# Patient Record
Sex: Female | Born: 1985 | Race: White | Hispanic: No | Marital: Married | Smoking: Never smoker
Health system: Southern US, Community
[De-identification: ages and names within clinical notes are randomized; demographics above are authoritative.]

## PROBLEM LIST (undated history)

## (undated) DIAGNOSIS — Z87442 Personal history of urinary calculi: Secondary | ICD-10-CM

## (undated) DIAGNOSIS — K648 Other hemorrhoids: Secondary | ICD-10-CM

## (undated) DIAGNOSIS — J45909 Unspecified asthma, uncomplicated: Secondary | ICD-10-CM

## (undated) DIAGNOSIS — R945 Abnormal results of liver function studies: Secondary | ICD-10-CM

## (undated) DIAGNOSIS — K859 Acute pancreatitis without necrosis or infection, unspecified: Secondary | ICD-10-CM

## (undated) DIAGNOSIS — Z9889 Other specified postprocedural states: Secondary | ICD-10-CM

## (undated) DIAGNOSIS — R112 Nausea with vomiting, unspecified: Secondary | ICD-10-CM

## (undated) DIAGNOSIS — N200 Calculus of kidney: Secondary | ICD-10-CM

## (undated) HISTORY — DX: Other hemorrhoids: K64.8

## (undated) HISTORY — DX: Abnormal results of liver function studies: R94.5

## (undated) SURGERY — Surgical Case
Anesthesia: *Unknown

---
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
## 2007-12-02 HISTORY — PX: KIDNEY STONE SURGERY: SHX686

## 2008-07-30 ENCOUNTER — Emergency Department: Payer: Self-pay | Admitting: Emergency Medicine

## 2008-08-21 ENCOUNTER — Ambulatory Visit: Payer: Self-pay | Admitting: Specialist

## 2008-10-02 ENCOUNTER — Ambulatory Visit (HOSPITAL_COMMUNITY): Admission: RE | Admit: 2008-10-02 | Discharge: 2008-10-02 | Payer: Self-pay | Admitting: Urology

## 2008-10-13 ENCOUNTER — Ambulatory Visit (HOSPITAL_BASED_OUTPATIENT_CLINIC_OR_DEPARTMENT_OTHER): Admission: RE | Admit: 2008-10-13 | Discharge: 2008-10-13 | Payer: Self-pay | Admitting: Urology

## 2011-04-15 NOTE — Op Note (Signed)
NAMESHANTORIA, ELLWOOD              ACCOUNT NO.:  [id number]   MEDICAL RECORD NO.:  [id number]          PATIENT TYPE:  AMB   LOCATION:  [facility]                         FACILITY:  [facility]   PHYSICIAN:  Lindaann Slough, M.D.  DATE OF BIRTH:  27-May-1986   DATE OF PROCEDURE:  10/13/2008  DATE OF DISCHARGE:                               OPERATIVE REPORT   ATTENDING:  Dr. Wendie Simmer Nesi.   ASSISTANT:  Dr. Duane Boston.   PREOPERATIVE DIAGNOSIS:  Left ureteral stone.  Symptomatic.   POSTOPERATIVE DIAGNOSIS:  Left ureteral stone.  Symptomatic.   INDICATIONS:  A 25 year old female with a left ureteral stone on  imaging.  Could not be seen on KUB and as the patient had persistent  symptoms, she elected to proceed with ureteroscopy.  Recent benefits of  this as well as alternatives were discussed with the patient and her  mother preoperatively.   PROCEDURES:  1. Pan cystourethroscopy.  2. Left-sided retrograde pyelogram.  3. Left-sided ureteroscopy with laser lithotripsy and basket-stone      extraction.  4. Left-sided ureteral stent placement.   PROCEDURE IN DETAIL:  The patient was brought back to the operating  room, and after the successful induction of LMA anesthetic, she was  placed in the dorsal lithotomy position.  All pressure points were  padded appropriately.  She was received preoperative antibiotics, and a  preoperative time out was performed.   Using a 22-French sheath with 12-degree lens, pan cystourethroscopy was  performed.  The patient's urethra appeared normal.  There was no  evidence of stricture, tumor, foreign body, or other anomaly.  On  entering the patient's bladder, no anomalies were seen.  Both ureteral  orifices were seen at the lateral aspects of the trigone effluxing clear  urine.  There was no evidence of tumor, stone, foreign body, or other  anomaly.  At this point, the left ureteral orifice was cannulated with a  5-French catheter.  Retrieved pyelogram showed  filling defect along the  mid proximal ureter.  At this point, a sensor wire was placed in the  upper collecting system with fluoroscopic guidance.  Then, using a semi-  rigid ureteroscope, we navigated the ureter, completing ureteroscopy,  and identifying the stone along the mid proximal ureter.  At this point,  using the laser settings of 0.5 joules and 5 Hz, the laser was broken up  into 3 pieces.  All fragments were removed in their entirety from the  ureter and sent for stone analysis.  At this point, we reinjected  ureter, and there was no further stones seen.  The ureteroscope was  removed, the bladder was drained, and a 6 x 24-French soft flex ureteral  stent was placed with good proximal and distal curl seen  fluoroscopically.  At this point, the procedure was ended.   Dr. Brunilda Payor was present throughout the entire case and was the responsible  surgeon.   ESTIMATED BLOOD LOSS:  Minimal.   URINE OUTPUT:  Unrecorded.   DRAINS:  A 6 x 24-French left tethered ureteral stent.   SPECIMEN:  Stone for analysis.  DISPOSITION:  The patient taken to the PACU for further care.      Delman Kitten, MD      Lindaann Slough, M.D.  Electronically Signed    DW/MEDQ  D:  10/13/2008  T:  10/13/2008  Job:  [id number]

## 2011-09-02 LAB — POCT PREGNANCY, URINE: Preg Test, Ur: NEGATIVE

## 2011-09-02 LAB — POCT HEMOGLOBIN-HEMACUE: Hemoglobin: 13.8

## 2012-02-15 ENCOUNTER — Encounter (HOSPITAL_COMMUNITY): Payer: Self-pay

## 2012-02-15 ENCOUNTER — Emergency Department (HOSPITAL_COMMUNITY)
Admission: EM | Admit: 2012-02-15 | Discharge: 2012-02-15 | Disposition: A | Discharge status: Home / Self Care | Payer: BC Managed Care – PPO | Source: Home / Self Care | Attending: Emergency Medicine | Admitting: Emergency Medicine

## 2012-02-15 DIAGNOSIS — R202 Paresthesia of skin: Secondary | ICD-10-CM

## 2012-02-15 DIAGNOSIS — R209 Unspecified disturbances of skin sensation: Secondary | ICD-10-CM

## 2012-02-15 LAB — POCT I-STAT, CHEM 8
BUN: 9 mg/dL (ref 6–23)
Creatinine, Ser: 0.6 mg/dL (ref 0.50–1.10)
Glucose, Bld: 105 mg/dL — ABNORMAL HIGH (ref 70–99)

## 2012-02-15 NOTE — ED Notes (Signed)
Pt has numbness of lt shoulder radiating to fingers and lt thigh numbness that radiates to her toes.  Woke during the pm and felt like her fingers were swollen and could change position and it would alleviate it.  Not history of numbness and states she does workout and had some upper and lower back pain earlier in the week.

## 2012-02-15 NOTE — ED Provider Notes (Signed)
History     CSN: [id number]  Arrival date & time 02/15/12  1101   First MD Initiated Contact with Patient 02/15/12 1135      Chief Complaint  Patient presents with  . Numbness    (Consider location/radiation/quality/duration/timing/severity/associated sxs/prior treatment) HPI Comments: Patient presents urgent care complaining that for approximately 3 days has been experiencing abnormal tingling sensations on both upper arms predominantly left upper arm and left leg radiating all way down to her toes. Patient describes yesterday having unusual sensation as she was sleeping that both of her hands were swollen when affected were not. In addition patient describes about 5-6 days ago she did start expressing some lower back and neck region discomfort and tenderness. She attributed this discomfort to a small pillow she slept on. Discomfort did improve but afterwards abnormal sensation developed.  Patient describes that went through a process of tapering down her Topamax for migraines that she was on taken her last dose somewhere in the month of January. [facility], dated described that she was experiencing some numbness and tingling was she was taken Topamax before. She's been off Topamax for about a month.  Patient denies any nausea, vomiting, fevers visual disturbances,    Patient is a 26 y.o. female presenting with neurologic complaint.  Neurologic Problem The primary symptoms include headaches, dizziness and paresthesias. Primary symptoms do not include altered mental status, seizures, speech change, memory loss, fever, nausea or vomiting. The symptoms began 3 to 5 days ago. The symptoms are unchanged. The neurological symptoms are multifocal.  The headache is associated with paresthesias. The headache is not associated with aura, neck stiffness, weakness or loss of balance.  Dizziness does not occur with tinnitus, nausea, vomiting or weakness.    Additional symptoms include lower back pain. Additional symptoms do not include neck stiffness, weakness, pain, leg pain, loss of balance, aura, hallucinations, hearing loss, tinnitus, vertigo, anxiety or dysphoric mood. Medical issues do not include seizures.    Past Medical History  Diagnosis Date  . Migraine     Past Surgical History  Procedure Date  . Cholecystectomy   . Kidney stone surgery     History reviewed. No pertinent family history.  History  Substance Use Topics  . Smoking status: Never Smoker   . Smokeless tobacco: Not on file  . Alcohol Use: Yes    OB History    Grav Para Term Preterm Abortions TAB SAB Ect Mult Living                  Review of Systems  Constitutional: Negative for fever, activity change, appetite change and fatigue.  HENT: Negative for hearing loss, neck stiffness and tinnitus.   Respiratory: Negative for cough and shortness of breath.   Gastrointestinal: Negative for nausea, vomiting and abdominal distention.  Musculoskeletal: Negative for back pain and arthralgias.  Neurological: Positive for dizziness, numbness, headaches and paresthesias. Negative for vertigo, tremors, speech change, seizures, syncope, facial asymmetry, speech difficulty, weakness and loss of balance.  Psychiatric/Behavioral: Negative for hallucinations, memory loss, dysphoric mood and altered mental status.    Allergies  Hydrocodone  Home Medications  No current outpatient prescriptions on file.  BP 125/84  Pulse 81  Temp(Src) 98.5 F (36.9 C) (Oral)  Resp 16  SpO2 100%  LMP 02/04/2012  Physical Exam  Nursing note and vitals reviewed. Constitutional: She is oriented to person, place, and time. She appears well-developed and well-nourished. No distress.  HENT:  Head: Normocephalic.  Eyes: Conjunctivae and EOM are normal. Pupils are equal, round, and reactive to light. Right eye exhibits no discharge. Left eye exhibits no discharge. No scleral icterus.   Neck: Trachea normal, normal range of motion and full passive range of motion without pain. Neck supple. Muscular tenderness present. No spinous process tenderness present. Carotid bruit is not present. No rigidity. No erythema and normal range of motion present.  Pulmonary/Chest: Breath sounds normal.  Neurological: She is alert and oriented to person, place, and time. She has normal strength. She displays no atrophy and no tremor. No cranial nerve deficit or sensory deficit. She exhibits normal muscle tone. She displays a negative Romberg sign. She displays no seizure activity. Coordination normal.       Sensorial exam- Normal proprioception bilaterally, conserve 2 point discrimination on both upper extremities and lower extremities. Muscular strength is 5 out of 5 in both upper extremities and lower extremities proprioception a sterognosia conserved no abnormalities noted  Skin: Skin is warm. No rash noted.       ED Course  Procedures (including critical care time)  Labs Reviewed - No data to display No results found.   No diagnosis found.    MDM   Patient presented to urgent care with neurological symptoms have been ongoing for less than a week. No focal neurological deficiencies were noted on exam. Unknown etiology for perceive paresthesias have her fill out a referral form for consultation with neurology. Otherwise patient to be alert overall on-call as hard nurse will help her schedule a followup appointment. Patient [facility] at [facility] before. Patient agree with plan of care and understood symptoms that would require emergent attention.       Jimmie Molly, MD 02/15/12 1328

## 2012-02-15 NOTE — Discharge Instructions (Signed)
We have discussed your symptoms, we will arrange a followup consult with a local neurologist. If any interim any changes as we discuss multiple symptoms that should require immediate medical attention in the emergency department. Monitor your condition or new symptoms. In light that you have some associated discomfort in her lower back and neck area take over-the-counter naproxen for 5 days 1 tablet every 12 hours. Our scheduling first we'll contact you within the next couple days after 3 days call us back if you have an receiving a phone call from our nurse.

## 2012-02-17 ENCOUNTER — Telehealth (HOSPITAL_COMMUNITY): Payer: Self-pay | Admitting: *Deleted

## 2012-02-17 NOTE — ED Notes (Signed)
Diane from [facility] called and scheduled pt. for 4/3 @ 0930. Pt. should arrive @ 0900 to do paperwork bring insurance card, copay and list of her medications. I called pt. and gave her this information. 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 Lasts? I told her I did not know she would have to ask them. She does not know if she can do it then. I told gave her the office number and told her she would have to reschedule the time if it is not convenient for her. Vassie Moselle 02/17/2012

## 2012-02-17 NOTE — ED Notes (Signed)
Pt. called on VM @ 1032 and asked about referral to [facility]. I talked to Dr. Ladon Applebaum and told him he had put [facility] on the referral request. He said that was OK, either one is fine. Pt.'s chart and referral request faxed to Diane -new patient coordinator for [facility] and confirmation received. I called and left message for pt. @ 1236. Pt. called back on VM 2 1244.  I called [facility] @ 1410 and left message for Diane to call me. 1603 I called and left a second message for Diane. I called pt. back and told her I was waiting to hear from [facility] about the appt. and would call her when I get it. Carrie Combs 02/17/2012

## 2012-10-06 LAB — OB RESULTS CONSOLE GC/CHLAMYDIA
Chlamydia: NEGATIVE
Gonorrhea: NEGATIVE

## 2012-10-06 LAB — OB RESULTS CONSOLE RUBELLA ANTIBODY, IGM: Rubella: IMMUNE

## 2012-10-06 LAB — OB RESULTS CONSOLE RPR: RPR: NONREACTIVE

## 2012-10-11 LAB — OB RESULTS CONSOLE ANTIBODY SCREEN: Antibody Screen: NEGATIVE

## 2012-12-01 NOTE — L&D Delivery Note (Signed)
Delivery Note At 2:50 PM a viable and healthy female was delivered via Vaginal, Spontaneous Delivery (Presentation: Left Occiput Anterior).  APGAR: 9, ; weight 5lbs 13 oz .   Placenta status: Intact, Spontaneous.  Cord: 3 vessels.  Anesthesia: Epidural  Episiotomy: None Lacerations: 2nd degree;Perineal Suture Repair: 3.0 chromic Est. Blood Loss (mL): 300  Mom to postpartum.  Baby to nursery-stable.  Idolina Mantell D 12/28/2012, 4:20 PM

## 2012-12-06 LAB — OB RESULTS CONSOLE GBS
GBS: NEGATIVE
GBS: POSITIVE

## 2012-12-27 ENCOUNTER — Inpatient Hospital Stay (HOSPITAL_COMMUNITY)
Admission: AD | Admit: 2012-12-27 | Discharge: 2012-12-28 | Disposition: A | Discharge status: Home / Self Care | Payer: BC Managed Care – PPO | Source: Ambulatory Visit | Attending: Obstetrics and Gynecology | Admitting: Obstetrics and Gynecology

## 2012-12-27 DIAGNOSIS — O479 False labor, unspecified: Secondary | ICD-10-CM | POA: Insufficient documentation

## 2012-12-27 HISTORY — DX: Personal history of urinary calculi: Z87.442

## 2012-12-27 NOTE — MAU Note (Signed)
Pt states contractions started around midnight on 1/27 and have progressively gotten stronger today. States she lost her mucus plug at 3pm and the contractions seemed to be even stronger and are about 5-6 minutes apart

## 2012-12-28 ENCOUNTER — Encounter (HOSPITAL_COMMUNITY): Payer: Self-pay | Admitting: Anesthesiology

## 2012-12-28 ENCOUNTER — Encounter (HOSPITAL_COMMUNITY): Payer: Self-pay

## 2012-12-28 ENCOUNTER — Inpatient Hospital Stay (HOSPITAL_COMMUNITY): Payer: BC Managed Care – PPO | Admitting: Anesthesiology

## 2012-12-28 ENCOUNTER — Inpatient Hospital Stay (HOSPITAL_COMMUNITY)
Admission: AD | Admit: 2012-12-28 | Discharge: 2012-12-30 | DRG: 373 | Disposition: A | Discharge status: Home / Self Care | Payer: BC Managed Care – PPO | Source: Ambulatory Visit | Attending: Obstetrics & Gynecology | Admitting: Obstetrics & Gynecology

## 2012-12-28 ENCOUNTER — Encounter (HOSPITAL_COMMUNITY): Payer: Self-pay | Admitting: *Deleted

## 2012-12-28 HISTORY — DX: Unspecified asthma, uncomplicated: J45.909

## 2012-12-28 LAB — OB RESULTS CONSOLE GBS: GBS: NEGATIVE

## 2012-12-28 LAB — CBC
HCT: 40.1 % (ref 36.0–46.0)
Hemoglobin: 13.9 g/dL (ref 12.0–15.0)
RBC: 4.36 MIL/uL (ref 3.87–5.11)
WBC: 15.4 10*3/uL — ABNORMAL HIGH (ref 4.0–10.5)

## 2012-12-28 LAB — RPR: RPR Ser Ql: NONREACTIVE

## 2012-12-28 MED ORDER — DIPHENHYDRAMINE HCL 25 MG PO CAPS: 25.0000 mg | ORAL_CAPSULE | Freq: Four times a day (QID) | ORAL | Status: DC | PRN

## 2012-12-28 MED ORDER — LIDOCAINE HCL (PF) 1 % IJ SOLN: 30.0000 mL | INTRAMUSCULAR | Status: DC | PRN

## 2012-12-28 MED ORDER — LACTATED RINGERS IV SOLN
500.0000 mL | Freq: Once | INTRAVENOUS | Status: AC
  Administered 2012-12-28: 1000 mL via INTRAVENOUS

## 2012-12-28 MED ORDER — ZOLPIDEM TARTRATE 5 MG PO TABS: 5.0000 mg | ORAL_TABLET | Freq: Every evening | ORAL | Status: DC | PRN

## 2012-12-28 MED ORDER — IBUPROFEN 600 MG PO TABS
600.0000 mg | ORAL_TABLET | Freq: Four times a day (QID) | ORAL | Status: DC | PRN
  Filled 2012-12-28: qty 1

## 2012-12-28 MED ORDER — LACTATED RINGERS IV SOLN
INTRAVENOUS | Status: DC
  Administered 2012-12-28 (×2): via INTRAVENOUS

## 2012-12-28 MED ORDER — ACETAMINOPHEN 325 MG PO TABS: 650.0000 mg | ORAL_TABLET | ORAL | Status: DC | PRN

## 2012-12-28 MED ORDER — DIPHENHYDRAMINE HCL 50 MG/ML IJ SOLN: 12.5000 mg | INTRAMUSCULAR | Status: DC | PRN

## 2012-12-28 MED ORDER — CITRIC ACID-SODIUM CITRATE 334-500 MG/5ML PO SOLN: 30.0000 mL | ORAL | Status: DC | PRN

## 2012-12-28 MED ORDER — PHENYLEPHRINE 40 MCG/ML (10ML) SYRINGE FOR IV PUSH (FOR BLOOD PRESSURE SUPPORT)
80.0000 ug | PREFILLED_SYRINGE | INTRAVENOUS | Status: DC | PRN
  Filled 2012-12-28: qty 5

## 2012-12-28 MED ORDER — FENTANYL 2.5 MCG/ML BUPIVACAINE 1/10 % EPIDURAL INFUSION (WH - ANES)
INTRAMUSCULAR | Status: DC | PRN
  Administered 2012-12-28: 14 mL/h via EPIDURAL

## 2012-12-28 MED ORDER — DIBUCAINE 1 % RE OINT: 1.0000 "application " | TOPICAL_OINTMENT | RECTAL | Status: DC | PRN

## 2012-12-28 MED ORDER — FLEET ENEMA 7-19 GM/118ML RE ENEM: 1.0000 | ENEMA | RECTAL | Status: DC | PRN

## 2012-12-28 MED ORDER — BUTORPHANOL TARTRATE 1 MG/ML IJ SOLN: 1.0000 mg | INTRAMUSCULAR | Status: DC | PRN

## 2012-12-28 MED ORDER — TERBUTALINE SULFATE 1 MG/ML IJ SOLN: 0.2500 mg | Freq: Once | INTRAMUSCULAR | Status: DC | PRN

## 2012-12-28 MED ORDER — WITCH HAZEL-GLYCERIN EX PADS: 1.0000 "application " | MEDICATED_PAD | CUTANEOUS | Status: DC | PRN

## 2012-12-28 MED ORDER — OXYTOCIN 40 UNITS IN LACTATED RINGERS INFUSION - SIMPLE MED
62.5000 mL/h | INTRAVENOUS | Status: DC
  Administered 2012-12-28: 62.5 mL/h via INTRAVENOUS

## 2012-12-28 MED ORDER — OXYTOCIN 40 UNITS IN LACTATED RINGERS INFUSION - SIMPLE MED
1.0000 m[IU]/min | INTRAVENOUS | Status: DC
  Administered 2012-12-28: 2 m[IU]/min via INTRAVENOUS
  Filled 2012-12-28: qty 1000

## 2012-12-28 MED ORDER — OXYCODONE-ACETAMINOPHEN 5-325 MG PO TABS: 1.0000 | ORAL_TABLET | ORAL | Status: DC | PRN

## 2012-12-28 MED ORDER — BENZOCAINE-MENTHOL 20-0.5 % EX AERO
1.0000 "application " | INHALATION_SPRAY | CUTANEOUS | Status: DC | PRN
  Filled 2012-12-28: qty 56

## 2012-12-28 MED ORDER — LANOLIN HYDROUS EX OINT: TOPICAL_OINTMENT | CUTANEOUS | Status: DC | PRN

## 2012-12-28 MED ORDER — EPHEDRINE 5 MG/ML INJ
10.0000 mg | INTRAVENOUS | Status: DC | PRN
  Filled 2012-12-28: qty 4

## 2012-12-28 MED ORDER — FENTANYL 2.5 MCG/ML BUPIVACAINE 1/10 % EPIDURAL INFUSION (WH - ANES)
14.0000 mL/h | INTRAMUSCULAR | Status: DC
  Administered 2012-12-28: 14 mL/h via EPIDURAL
  Filled 2012-12-28 (×2): qty 125

## 2012-12-28 MED ORDER — LACTATED RINGERS IV SOLN: 500.0000 mL | INTRAVENOUS | Status: DC | PRN

## 2012-12-28 MED ORDER — OXYTOCIN BOLUS FROM INFUSION: 500.0000 mL | INTRAVENOUS | Status: DC

## 2012-12-28 MED ORDER — ONDANSETRON HCL 4 MG PO TABS: 4.0000 mg | ORAL_TABLET | ORAL | Status: DC | PRN

## 2012-12-28 MED ORDER — SENNOSIDES-DOCUSATE SODIUM 8.6-50 MG PO TABS
2.0000 | ORAL_TABLET | Freq: Every day | ORAL | Status: DC
  Administered 2012-12-28 – 2012-12-29 (×2): 2 via ORAL

## 2012-12-28 MED ORDER — ONDANSETRON HCL 4 MG/2ML IJ SOLN: 4.0000 mg | INTRAMUSCULAR | Status: DC | PRN

## 2012-12-28 MED ORDER — PRENATAL MULTIVITAMIN CH
1.0000 | ORAL_TABLET | Freq: Every day | ORAL | Status: DC
  Administered 2012-12-29 – 2012-12-30 (×2): 1 via ORAL
  Filled 2012-12-28 (×2): qty 1

## 2012-12-28 MED ORDER — LIDOCAINE HCL (PF) 1 % IJ SOLN
INTRAMUSCULAR | Status: DC | PRN
  Administered 2012-12-28 (×2): 4 mL

## 2012-12-28 MED ORDER — SIMETHICONE 80 MG PO CHEW: 80.0000 mg | CHEWABLE_TABLET | ORAL | Status: DC | PRN

## 2012-12-28 MED ORDER — IBUPROFEN 600 MG PO TABS
600.0000 mg | ORAL_TABLET | Freq: Four times a day (QID) | ORAL | Status: DC
  Administered 2012-12-28 – 2012-12-29 (×5): 600 mg via ORAL
  Filled 2012-12-28 (×8): qty 1

## 2012-12-28 MED ORDER — EPHEDRINE 5 MG/ML INJ: 10.0000 mg | INTRAVENOUS | Status: DC | PRN

## 2012-12-28 MED ORDER — ONDANSETRON HCL 4 MG/2ML IJ SOLN
4.0000 mg | Freq: Four times a day (QID) | INTRAMUSCULAR | Status: DC | PRN
  Administered 2012-12-28: 4 mg via INTRAVENOUS
  Filled 2012-12-28: qty 2

## 2012-12-28 MED ORDER — TETANUS-DIPHTH-ACELL PERTUSSIS 5-2.5-18.5 LF-MCG/0.5 IM SUSP: 0.5000 mL | Freq: Once | INTRAMUSCULAR | Status: DC

## 2012-12-28 MED ORDER — PHENYLEPHRINE 40 MCG/ML (10ML) SYRINGE FOR IV PUSH (FOR BLOOD PRESSURE SUPPORT): 80.0000 ug | PREFILLED_SYRINGE | INTRAVENOUS | Status: DC | PRN

## 2012-12-28 NOTE — Anesthesia Preprocedure Evaluation (Signed)
Anesthesia Evaluation  Patient identified by MRN, date of birth, ID band Patient awake    Reviewed: Allergy & Precautions, H&P , Patient's Chart, lab work & pertinent test results  Airway Mallampati: III TM Distance: >3 FB Neck ROM: full    Dental No notable dental hx. (+) Teeth Intact   Pulmonary neg pulmonary ROS,  breath sounds clear to auscultation  Pulmonary exam normal       Cardiovascular negative cardio ROS  Rhythm:regular Rate:Normal     Neuro/Psych negative psych ROS   GI/Hepatic negative GI ROS, Neg liver ROS,   Endo/Other  negative endocrine ROS  Renal/GU Renal Calculi  negative genitourinary   Musculoskeletal   Abdominal Normal abdominal exam  (+)   Peds  Hematology negative hematology ROS (+)   Anesthesia Other Findings   Reproductive/Obstetrics (+) Pregnancy                           Anesthesia Physical Anesthesia Plan  ASA: II  Anesthesia Plan: Epidural   Post-op Pain Management:    Induction:   Airway Management Planned:   Additional Equipment:   Intra-op Plan:   Post-operative Plan:   Informed Consent: I have reviewed the patients History and Physical, chart, labs and discussed the procedure including the risks, benefits and alternatives for the proposed anesthesia with the patient or authorized representative who has indicated his/her understanding and acceptance.     Plan Discussed with: Anesthesiologist  Anesthesia Plan Comments:         Anesthesia Quick Evaluation

## 2012-12-28 NOTE — Anesthesia Postprocedure Evaluation (Signed)
  Anesthesia Post-op Note  Patient: Carrie Combs  Procedure(s) Performed: * No procedures listed *  Patient Location: PACU and Mother/Baby  Anesthesia Type:Epidural  Level of Consciousness: awake, alert  and oriented  Airway and Oxygen Therapy: Patient Spontanous Breathing  Post-op Pain: mild  Post-op Assessment: Patient's Cardiovascular Status Stable, Respiratory Function Stable, No signs of Nausea or vomiting, Adequate PO intake, Pain level controlled, No headache, No backache, No residual numbness and No residual motor weakness  Post-op Vital Signs: stable  Complications: No apparent anesthesia complications

## 2012-12-28 NOTE — H&P (Signed)
27 y.o. G1P0  Estimated Date of Delivery: 01/01/13 admitted at 39/[redacted] weeks gestation in dysfunctional latent phase labor.  Patient with persistent but irregular contractions.  Progressed from 2/50 to 3/80 in 7 hours.  Prenatal Transfer Tool  Maternal Diabetes: No Genetic Screening: Normal Maternal Ultrasounds/Referrals: Normal Fetal Ultrasounds or other Referrals:  None Maternal Substance Abuse:  No Significant Maternal Medications:  None Significant Maternal Lab Results: None Other Significant Pregnancy Complications:  None  Afebrile, VSS Heart and Lungs: No active disease Abdomen: soft, gravid, EFW AGA. Cervical exam:  3/80  Impression: Dysfunctional latent phase  Plan:  Admit for IV pitocin and TOL.

## 2012-12-28 NOTE — MAU Note (Signed)
Pt would like to take placenta home for encapsulation

## 2012-12-28 NOTE — Anesthesia Procedure Notes (Signed)
Epidural Patient location during procedure: OB Start time: 12/28/2012 8:54 AM  Staffing Anesthesiologist: Kiaraliz Rafuse A. Performed by: anesthesiologist   Preanesthetic Checklist Completed: patient identified, site marked, surgical consent, pre-op evaluation, timeout performed, IV checked, risks and benefits discussed and monitors and equipment checked  Epidural Patient position: sitting Prep: site prepped and draped and DuraPrep Patient monitoring: continuous pulse ox and blood pressure Approach: midline Injection technique: LOR air  Needle:  Needle type: Tuohy  Needle gauge: 17 G Needle length: 9 cm and 9 Needle insertion depth: 4 cm Catheter type: closed end flexible Catheter size: 19 Gauge Catheter at skin depth: 8 cm Test dose: negative and Other  Assessment Events: blood not aspirated, injection not painful, no injection resistance, negative IV test and no paresthesia  Additional Notes Patient identified. Risks and benefits discussed including failed block, incomplete  Pain control, post dural puncture headache, nerve damage, paralysis, blood pressure Changes, nausea, vomiting, reactions to medications-both toxic and allergic and post Partum back pain. All questions were answered. Patient expressed understanding and wished to proceed. Sterile technique was used throughout procedure. Epidural site was Dressed with sterile barrier dressing. No paresthesias, signs of intravascular injection Or signs of intrathecal spread were encountered.  Patient was more comfortable after the epidural was dosed. Please see RN's note for documentation of vital signs and FHR which are stable.

## 2012-12-28 NOTE — MAU Note (Signed)
Pt came in for labor eval earlier today, was sent home, 1-2/50/posterior, here because ctx's are stronger, unable to sleep.

## 2012-12-29 LAB — CBC
MCHC: 33.5 g/dL (ref 30.0–36.0)
Platelets: 157 10*3/uL (ref 150–400)
RDW: 13 % (ref 11.5–15.5)
WBC: 15.7 10*3/uL — ABNORMAL HIGH (ref 4.0–10.5)

## 2012-12-29 NOTE — Progress Notes (Signed)
PPD#1 Pt has no complaints. Lochia-wnl VSSAF IMP/ stable PLAN/ Routine care.

## 2012-12-30 MED ORDER — IBUPROFEN 600 MG PO TABS: 600.0000 mg | ORAL_TABLET | Freq: Four times a day (QID) | ORAL | Status: DC | PRN

## 2012-12-30 MED ORDER — DOCUSATE SODIUM 100 MG PO CAPS: 100.0000 mg | ORAL_CAPSULE | Freq: Two times a day (BID) | ORAL | Status: DC

## 2012-12-30 NOTE — Discharge Summary (Signed)
Obstetric Discharge Summary Reason for Admission: onset of labor Prenatal Procedures: ultrasound Intrapartum Procedures: spontaneous vaginal delivery Postpartum Procedures: none Complications-Operative and Postpartum: 2nd degree perineal laceration Hemoglobin  Date Value Range Status  12/29/2012 10.7* 12.0 - 15.0 g/dL Final     DELTA CHECK NOTED     REPEATED TO VERIFY     HCT  Date Value Range Status  12/29/2012 31.9* 36.0 - 46.0 % Final    Physical Exam:  General: alert, cooperative and appears stated age 27: appropriate Uterine Fundus: firm  Discharge Diagnoses: Term Pregnancy-delivered  Discharge Information: Date: 12/30/2012 Activity: pelvic rest Diet: routine Medications: Ibuprofen, Colace and Vicodin Condition: improved Instructions: refer to practice specific booklet Discharge to: home Follow-up Information    Follow up with Mickel Baas, MD. In 4 weeks. (For a postpartum evaluation)    Contact information:   [address] [place] [address] [phone]          Newborn Data: Live born female  Birth Weight: 5 lb 13 oz (2637 g) APGAR: 9,   Home with mother.  Arriana Lohmann H. 12/30/2012, 8:55 AM

## 2013-07-29 ENCOUNTER — Ambulatory Visit (INDEPENDENT_AMBULATORY_CARE_PROVIDER_SITE_OTHER): Payer: BC Managed Care – PPO | Admitting: Surgery

## 2013-07-29 ENCOUNTER — Encounter (INDEPENDENT_AMBULATORY_CARE_PROVIDER_SITE_OTHER): Payer: Self-pay | Admitting: Surgery

## 2013-07-29 VITALS — BP 114/66 | HR 64 | Temp 98.1°F | Resp 14 | Ht 63.0 in | Wt 99.6 lb

## 2013-07-29 DIAGNOSIS — K602 Anal fissure, unspecified: Secondary | ICD-10-CM

## 2013-07-29 NOTE — Progress Notes (Signed)
Patient ID: Carrie Combs, female   DOB: 17-Oct-1986, 27 y.o.   MRN: [id number]  Chief Complaint  Patient presents with  . New Evaluation    eval anal fissure    HPI Carrie Combs is a 27 y.o. female.  Patient sent at request of Dr. Arlyce Dice for anal fissure. She had a natural vaginal childbirth in January of this year but had tearing in her perineum that has been managed medically on and off for the last 7 months. She has been treated for an anal fissure that heals and then cracks after bowel movements. She has had some rectal bleeding. Her symptoms are intermittent and include severe anal pain and rectal bleeding. She has tried topical steroid creams with marginal success. Reports no history of constipation but does strain from time to time. She does report intermittent painful intercourse as well. HPI  Past Medical History  Diagnosis Date  . Migraine   . History of kidney stones   . Asthma     exercise-induced    Past Surgical History  Procedure Laterality Date  . Cholecystectomy    . Kidney stone surgery    . Wisdom tooth extraction      Family History  Problem Relation Age of Onset  . Other Neg Hx   . Cancer Paternal Grandfather     liver    Social History History  Substance Use Topics  . Smoking status: Never Smoker   . Smokeless tobacco: Never Used  . Alcohol Use: Yes     Comment: once weekly    Allergies  Allergen Reactions  . Hydrocodone Nausea And Vomiting    Current Outpatient Prescriptions  Medication Sig Dispense Refill  . Prenatal Vit-Fe Fumarate-FA (PRENATAL MULTIVITAMIN) TABS Take 1 tablet by mouth at bedtime.       No current facility-administered medications for this visit.    Review of Systems Review of Systems  Constitutional: Negative.   HENT: Negative.   Eyes: Negative.   Respiratory: Negative.   Cardiovascular: Negative.   Gastrointestinal: Positive for blood in stool and anal bleeding.  Endocrine: Negative.   Genitourinary:  Negative.   Allergic/Immunologic: Negative.   Neurological: Negative.   Hematological: Negative.   Psychiatric/Behavioral: Negative.     Blood pressure 114/66, pulse 64, temperature 98.1 F (36.7 C), temperature source Temporal, resp. rate 14, height 5\' 3"  (1.6 m), weight 99 lb 9.6 oz (45.178 kg).  Physical Exam Physical Exam  Constitutional: She appears well-developed and well-nourished.  HENT:  Head: Normocephalic and atraumatic.  Genitourinary:     Skin: Skin is warm and dry.  Psychiatric: She has a normal mood and affect. Her behavior is normal. Thought content normal.    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 Start diltiazem gel 4 times a day. Recommend high-fiber diet and fiber supplement. Return to clinic in 3 weeks for reassessment. If no better, may need referral to colorectal specialist given history of traumatic childbirth and potential for damage to pelvic floor and sphincter mechanism. May need ultrasound for further evaluation.       Alee Katen A. 07/29/2013, 9:46 AM

## 2013-07-29 NOTE — Patient Instructions (Addendum)
Anal Fissure, Adult  An anal fissure is a small tear or crack in the skin around the anus. Bleeding from a fissure usually stops on its own within a few minutes. However, bleeding will often reoccur with each bowel movement until the crack heals.   CAUSES    Passing large, hard stools.   Frequent diarrheal stools.   Constipation.   Inflammatory bowel disease (Crohn's disease or ulcerative colitis).   Infections.   Anal sex.  SYMPTOMS    Small amounts of blood seen on your stools, on toilet paper, or in the toilet after a bowel movement.   Rectal bleeding.   Painful bowel movements.   Itching or irritation around the anus.  DIAGNOSIS  Your caregiver will examine the anal area. An anal fissure can usually be seen with careful inspection. A rectal exam may be performed and a short tube (anoscope) may be used to examine the anal canal.  TREATMENT    You may be instructed to take fiber supplements. These supplements can soften your stool to help make bowel movements easier.   Sitz baths may be recommended to help heal the tear. Do not use soap in the sitz baths.   A medicated cream or ointment may be prescribed to lessen discomfort.  HOME CARE INSTRUCTIONS    Maintain a diet high in fruits, whole grains, and vegetables. Avoid constipating foods like bananas and dairy products.   Take sitz baths as directed by your caregiver.   Drink enough fluids to keep your urine clear or pale yellow.   Only take over-the-counter or prescription medicines for pain, discomfort, or fever as directed by your caregiver. Do not take aspirin as this may increase bleeding.   Do not use ointments containing numbing medications (anesthetics) or hydrocortisone. They could slow healing.  SEEK MEDICAL CARE IF:    Your fissure is not completely healed within 3 days.   You have further bleeding.   You have a fever.   You have diarrhea mixed with blood.   You have pain.   Your problem is getting worse rather than  better.  MAKE SURE YOU:    Understand these instructions.   Will watch your condition.   Will get help right away if you are not doing well or get worse.  Document Released: 11/17/2005 Document Revised: 02/09/2012 Document Reviewed: 05/04/2011  [employer] Patient Information 2014 [employer]

## 2013-08-15 ENCOUNTER — Encounter (INDEPENDENT_AMBULATORY_CARE_PROVIDER_SITE_OTHER): Payer: BC Managed Care – PPO | Admitting: Surgery

## 2013-08-25 ENCOUNTER — Ambulatory Visit (INDEPENDENT_AMBULATORY_CARE_PROVIDER_SITE_OTHER): Payer: BC Managed Care – PPO | Admitting: Surgery

## 2013-08-25 ENCOUNTER — Encounter (INDEPENDENT_AMBULATORY_CARE_PROVIDER_SITE_OTHER): Payer: Self-pay | Admitting: Surgery

## 2013-08-25 VITALS — BP 98/60 | HR 68 | Temp 97.4°F | Resp 16 | Ht 63.0 in | Wt 97.4 lb

## 2013-08-25 DIAGNOSIS — K602 Anal fissure, unspecified: Secondary | ICD-10-CM

## 2013-08-25 NOTE — Progress Notes (Signed)
Subjective:     Patient ID: Carrie Combs, female   DOB: [birth year], 27 y.o.   MRN: [id number]  HPI Patient returns for followup of anal fissure. She is feeling better. She has less pain. She does feel a sensation of stretching with her bowel movements especially anterior right anal canal. No bleeding or severe pain. Bowel function normal.  Review of Systems  Constitutional: Negative.   HENT: Negative.   Gastrointestinal: Negative.        Objective:   Physical Exam  Constitutional: She is oriented to person, place, and time. She appears well-developed.  Genitourinary:     Musculoskeletal: Normal range of motion.  Neurological: She is alert and oriented to person, place, and time.  Skin: Skin is warm and dry.       Assessment:     Anal fissure improving on medical management.    Plan:     Continue medical management. Return in 3 weeks for recheck.

## 2013-08-25 NOTE — Patient Instructions (Signed)
Continue present treatment.  Return 3 weeks.

## 2013-09-16 ENCOUNTER — Encounter (INDEPENDENT_AMBULATORY_CARE_PROVIDER_SITE_OTHER): Payer: BC Managed Care – PPO | Admitting: Surgery

## 2013-09-30 ENCOUNTER — Telehealth (INDEPENDENT_AMBULATORY_CARE_PROVIDER_SITE_OTHER): Payer: Self-pay

## 2013-09-30 NOTE — Telephone Encounter (Signed)
Called pt and gave her appt with Dr Maisie Fus 11/10 at 9:40.

## 2013-09-30 NOTE — Telephone Encounter (Signed)
Message copied by Brennan Bailey on Fri Sep 30, 2013  2:53 PM ------      Message from: Isaias Sakai K      Created: Thu Sep 29, 2013 11:17 AM      Regarding: Dr Luisa Hart      Contact: [phone]       Patient is ready to see the other doctor that Dr Luisa Hart wants her to see which is in this practice. She can't remember who that is. Would like a call ------

## 2013-10-10 ENCOUNTER — Ambulatory Visit (INDEPENDENT_AMBULATORY_CARE_PROVIDER_SITE_OTHER): Payer: BC Managed Care – PPO | Admitting: General Surgery

## 2013-10-10 ENCOUNTER — Encounter (INDEPENDENT_AMBULATORY_CARE_PROVIDER_SITE_OTHER): Payer: Self-pay | Admitting: General Surgery

## 2013-10-10 VITALS — BP 108/70 | HR 84 | Temp 97.4°F | Resp 18 | Ht 63.0 in | Wt 98.0 lb

## 2013-10-10 DIAGNOSIS — K6289 Other specified diseases of anus and rectum: Secondary | ICD-10-CM

## 2013-10-10 NOTE — Patient Instructions (Addendum)
Make sure you are taking your fiber supplement and a stool softener daily.   Use warm baths after BM's.  Continue diltiazem until you've stopped breast feeding        GETTING TO GOOD BOWEL HEALTH. Irregular bowel habits such as constipation can lead to many problems over time.  Having one soft bowel movement a day is the most important way to prevent further problems.  The anorectal canal is designed to handle stretching and feces to safely manage our ability to get rid of solid waste (feces, poop, stool) out of our body.  BUT, hard constipated stools can act like ripping concrete bricks causing inflamed hemorrhoids, anal fissures, abdominal pain and bloating.     The goal: ONE SOFT BOWEL MOVEMENT A DAY!  To have soft, regular bowel movements:    Drink at least 8 tall glasses of water a day.     Take plenty of fiber.  Fiber is the undigested part of plant food that passes into the colon, acting s "natures broom" to encourage bowel motility and movement.  Fiber can absorb and hold large amounts of water. This results in a larger, bulkier stool, which is soft and easier to pass. Work gradually over several weeks up to 6 servings a day of fiber (25g a day even more if needed) in the form of: o Vegetables -- Root (potatoes, carrots, turnips), leafy green (lettuce, salad greens, celery, spinach), or cooked high residue (cabbage, broccoli, etc) o Fruit -- Fresh (unpeeled skin & pulp), Dried (prunes, apricots, cherries, etc ),  or stewed ( applesauce)  o Whole grain breads, pasta, etc (whole wheat)  o Bran cereals    Bulking Agents -- This type of water-retaining fiber generally is easily obtained each day by one of the following:  o Psyllium bran -- The psyllium plant is remarkable because its ground seeds can retain so much water. This product is available as Metamucil, Konsyl, Effersyllium, Per Diem Fiber, or the less expensive generic preparation in drug and health food stores. Although labeled a  laxative, it really is not a laxative.  o Methylcellulose -- This is another fiber derived from wood which also retains water. It is available as Citrucel. o Polyethylene Glycol - and "artificial" fiber commonly called Miralax or Glycolax.  It is helpful for people with gassy or bloated feelings with regular fiber o Flax Seed - a less gassy fiber than psyllium   No reading or other relaxing activity while on the toilet. If bowel movements take longer than 5 minutes, you are too constipated   AVOID CONSTIPATION.  High fiber and water intake usually takes care of this.  Sometimes a laxative is needed to stimulate more frequent bowel movements, but    Laxatives are not a good long-term solution as it can wear the colon out. o Osmotics (Milk of Magnesia, Fleets phosphosoda, Magnesium citrate, MiraLax, GoLytely) are safer than  o Stimulants (Senokot, Castor Oil, Dulcolax, Ex Lax)    o Do not take laxatives for more than 7days in a row.    IF SEVERELY CONSTIPATED, try a Bowel Retraining Program: o Do not use laxatives.  o Eat a diet high in roughage, such as bran cereals and leafy vegetables.  o Drink six (6) ounces of prune or apricot juice each morning.  o Eat two (2) large servings of stewed fruit each day.  o Take one (1) heaping tablespoon of a psyllium-based bulking agent twice a day. Use sugar-free sweetener when possible to avoid  excessive calories.  o Eat a normal breakfast.  o Set aside 15 minutes after breakfast to sit on the toilet, but do not strain to have a bowel movement.  o If you do not have a bowel movement by the third day, use an enema and repeat the above steps.

## 2013-10-10 NOTE — Progress Notes (Signed)
Chief Complaint  Patient presents with  . Other    anal fissure    HISTORY: Carrie Combs is a 27 y.o. female who presents to the office with anal pain.  Other symptoms include swelling.  This had been occurring for several months.  She has tried diltiazem cream in the past for 2 months with some improvement but continues to have pain and a stretching sensation with bleeding during BM's.  It is continuous in nature.  Her bowel habits are regular and her bowel movements are mostly soft.  Her fiber intake is through a supplement every other day.  She had a minor posterior vaginal tear during her childbirth in Jan and a very hard stool ~2 weeks later.  She has had problems with this since then.       Past Medical History  Diagnosis Date  . Migraine   . History of kidney stones   . Asthma     exercise-induced      Past Surgical History  Procedure Laterality Date  . Cholecystectomy    . Kidney stone surgery    . Wisdom tooth extraction          Current Outpatient Prescriptions  Medication Sig Dispense Refill  . Prenatal Vit-Fe Fumarate-FA (PRENATAL MULTIVITAMIN) TABS Take 1 tablet by mouth at bedtime.       No current facility-administered medications for this visit.      Allergies  Allergen Reactions  . Hydrocodone Nausea And Vomiting      Family History  Problem Relation Age of Onset  . Other Neg Hx   . Cancer Paternal Grandfather     liver    History   Social History  . Marital Status: Single    Spouse Name: N/A    Number of Children: N/A  . Years of Education: N/A   Social History Main Topics  . Smoking status: Never Smoker   . Smokeless tobacco: Never Used  . Alcohol Use: Yes     Comment: once weekly  . Drug Use: No  . Sexual Activity: No   Other Topics Concern  . None   Social History Narrative  . None      REVIEW OF SYSTEMS - PERTINENT POSITIVES ONLY: Review of Systems - General ROS: negative for - chills, fever or weight loss Hematological and  Lymphatic ROS: negative for - bleeding problems, blood clots or bruising Respiratory ROS: no cough, shortness of breath, or wheezing Cardiovascular ROS: no chest pain or dyspnea on exertion Gastrointestinal ROS: no abdominal pain, change in bowel habits, or black or bloody stools Genito-Urinary ROS: no dysuria, trouble voiding, or hematuria  EXAM: Filed Vitals:   10/10/13 0932  BP: 108/70  Pulse: 84  Temp: 97.4 F (36.3 C)  Resp: 18    General appearance: alert and cooperative Resp: clear to auscultation bilaterally Cardio: regular rate and rhythm GI: soft, non-tender; bowel sounds normal; no masses,  no organomegaly Anal: no fissures, small minor breaks in the perianal skin, perineal body intact, slight sphincter hypertrophy.  She did have some pain to palpation in the posterior anal wall    ASSESSMENT AND PLAN:  Carrie Combs is a 27 y.o. F with h/o anal fissure.  On exam, I do not see evidence of a true anal fissure now but she does have some pain posteriorly and sphincter hypertrophy.  She will continue her diltiazem for now and add a stool softener and fiber supplement daily.  If she continues to have  problems, once she has stopped breast feeding, we will switch her to nitroglycerine cream.  I do not think a surgery or botox would help, given the abscess of a true fissure.     Carrie Panda, MD Colon and Rectal Surgery / General Surgery [facility]      Visit Diagnoses: 1. Anal pain     Primary Care Physician: No primary provider on file.

## 2013-12-20 ENCOUNTER — Encounter (INDEPENDENT_AMBULATORY_CARE_PROVIDER_SITE_OTHER): Payer: Self-pay | Admitting: General Surgery

## 2014-01-19 ENCOUNTER — Emergency Department: Payer: Self-pay | Admitting: Internal Medicine

## 2014-01-19 LAB — COMPREHENSIVE METABOLIC PANEL
ALK PHOS: 139 U/L — AB
ALT: 125 U/L — AB (ref 12–78)
Albumin: 4.4 g/dL (ref 3.4–5.0)
Anion Gap: 8 (ref 7–16)
BUN: 13 mg/dL (ref 7–18)
Bilirubin,Total: 1 mg/dL (ref 0.2–1.0)
CALCIUM: 9.6 mg/dL (ref 8.5–10.1)
CHLORIDE: 103 mmol/L (ref 98–107)
CO2: 25 mmol/L (ref 21–32)
Creatinine: 0.69 mg/dL (ref 0.60–1.30)
EGFR (African American): >60
Glucose: 118 mg/dL — ABNORMAL HIGH (ref 65–99)
Osmolality: 273 (ref 275–301)
POTASSIUM: 3.9 mmol/L (ref 3.5–5.1)
SGOT(AST): 193 U/L — ABNORMAL HIGH (ref 15–37)
SODIUM: 136 mmol/L (ref 136–145)
Total Protein: 8.4 g/dL — ABNORMAL HIGH (ref 6.4–8.2)

## 2014-01-19 LAB — CBC
HCT: 47 % (ref 35.0–47.0)
HGB: 16 g/dL (ref 12.0–16.0)
MCH: 30.8 pg (ref 26.0–34.0)
MCHC: 34.1 g/dL (ref 32.0–36.0)
MCV: 90 fL (ref 80–100)
PLATELETS: 253 10*3/uL (ref 150–440)
RBC: 5.21 10*6/uL — AB (ref 3.80–5.20)
RDW: 12.7 % (ref 11.5–14.5)
WBC: 15.6 10*3/uL — ABNORMAL HIGH (ref 3.6–11.0)

## 2014-01-19 LAB — URINALYSIS, COMPLETE
BILIRUBIN, UR: NEGATIVE
Bacteria: NONE SEEN
Blood: NEGATIVE
Glucose,UR: NEGATIVE mg/dL (ref 0–75)
Leukocyte Esterase: NEGATIVE
Nitrite: NEGATIVE
PROTEIN: NEGATIVE
Ph: 6 (ref 4.5–8.0)
RBC,UR: <1 /HPF (ref 0–5)
Specific Gravity: 1.027 (ref 1.003–1.030)
Squamous Epithelial: 1

## 2014-01-19 LAB — LIPASE, BLOOD: Lipase: 120 U/L (ref 73–393)

## 2014-01-19 LAB — PREGNANCY, URINE: Pregnancy Test, Urine: NEGATIVE m[IU]/mL

## 2014-07-11 ENCOUNTER — Emergency Department (HOSPITAL_BASED_OUTPATIENT_CLINIC_OR_DEPARTMENT_OTHER)
Admission: EM | Admit: 2014-07-11 | Discharge: 2014-07-11 | Disposition: A | Discharge status: Home / Self Care | Payer: Managed Care, Other (non HMO) | Attending: Emergency Medicine | Admitting: Emergency Medicine

## 2014-07-11 ENCOUNTER — Encounter (HOSPITAL_BASED_OUTPATIENT_CLINIC_OR_DEPARTMENT_OTHER): Payer: Self-pay | Admitting: Emergency Medicine

## 2014-07-11 ENCOUNTER — Emergency Department (HOSPITAL_BASED_OUTPATIENT_CLINIC_OR_DEPARTMENT_OTHER): Payer: Managed Care, Other (non HMO)

## 2014-07-11 DIAGNOSIS — Z79899 Other long term (current) drug therapy: Secondary | ICD-10-CM | POA: Diagnosis not present

## 2014-07-11 DIAGNOSIS — Z87442 Personal history of urinary calculi: Secondary | ICD-10-CM | POA: Diagnosis not present

## 2014-07-11 DIAGNOSIS — Z8679 Personal history of other diseases of the circulatory system: Secondary | ICD-10-CM | POA: Diagnosis not present

## 2014-07-11 DIAGNOSIS — R1012 Left upper quadrant pain: Secondary | ICD-10-CM | POA: Insufficient documentation

## 2014-07-11 DIAGNOSIS — O9989 Other specified diseases and conditions complicating pregnancy, childbirth and the puerperium: Secondary | ICD-10-CM | POA: Diagnosis present

## 2014-07-11 DIAGNOSIS — J45909 Unspecified asthma, uncomplicated: Secondary | ICD-10-CM | POA: Insufficient documentation

## 2014-07-11 LAB — URINALYSIS, ROUTINE W REFLEX MICROSCOPIC
Bilirubin Urine: NEGATIVE
GLUCOSE, UA: NEGATIVE mg/dL
HGB URINE DIPSTICK: NEGATIVE
KETONES UR: 15 mg/dL — AB
Nitrite: NEGATIVE
PH: 7.5 (ref 5.0–8.0)
Protein, ur: 30 mg/dL — AB
Specific Gravity, Urine: 1.023 (ref 1.005–1.030)
Urobilinogen, UA: 0.2 mg/dL (ref 0.0–1.0)

## 2014-07-11 LAB — URINE MICROSCOPIC-ADD ON

## 2014-07-11 MED ORDER — OXYCODONE-ACETAMINOPHEN 5-325 MG PO TABS: 2.0000 | ORAL_TABLET | ORAL | Status: DC | PRN

## 2014-07-11 MED ORDER — ONDANSETRON HCL 4 MG/2ML IJ SOLN
4.0000 mg | Freq: Once | INTRAMUSCULAR | Status: DC
  Filled 2014-07-11: qty 2

## 2014-07-11 MED ORDER — MORPHINE SULFATE 4 MG/ML IJ SOLN
4.0000 mg | Freq: Once | INTRAMUSCULAR | Status: AC
  Administered 2014-07-11: 4 mg via INTRAVENOUS
  Filled 2014-07-11: qty 1

## 2014-07-11 MED ORDER — SODIUM CHLORIDE 0.9 % IV SOLN
Freq: Once | INTRAVENOUS | Status: AC
  Administered 2014-07-11: 14:00:00 via INTRAVENOUS

## 2014-07-11 MED ORDER — ONDANSETRON HCL 4 MG/2ML IJ SOLN
4.0000 mg | Freq: Once | INTRAMUSCULAR | Status: AC
  Administered 2014-07-11: 4 mg via INTRAVENOUS

## 2014-07-11 NOTE — Discharge Instructions (Signed)

## 2014-07-11 NOTE — ED Provider Notes (Signed)
CSN: [id number]     Arrival date & time 07/11/14  1310 History   None    Chief Complaint  Patient presents with  . Abdominal Pain  . Flank Pain     (Consider location/radiation/quality/duration/timing/severity/associated sxs/prior Treatment) Patient is a 28 y.o. female presenting with flank pain. The history is provided by the patient. No language interpreter was used.  Flank Pain This is a new problem. The current episode started today. The problem occurs constantly. The problem has been unchanged. Associated symptoms include abdominal pain. Nothing aggravates the symptoms. She has tried nothing for the symptoms. The treatment provided moderate relief.  Pt complains of severe left flank pain.   Pt reports she has had kidney stones in the past.  Pt reports this is similar pain.  Pt is [redacted] weeks pregnant.  Pt had ultrasound at Dr. Ewell PoeAnderson's yesterday which shows iup at 6 weeks  Past Medical History  Diagnosis Date  . Migraine   . History of kidney stones   . Asthma     exercise-induced   Past Surgical History  Procedure Laterality Date  . Cholecystectomy    . Kidney stone surgery    . Wisdom tooth extraction     Family History  Problem Relation Age of Onset  . Other Neg Hx   . Cancer Paternal Grandfather     liver   History  Substance Use Topics  . Smoking status: Never Smoker   . Smokeless tobacco: Never Used  . Alcohol Use: No   OB History   Grav Para Term Preterm Abortions TAB SAB Ect Mult Living   2 1 1  0 0 0 0 0 0 1     Review of Systems  Gastrointestinal: Positive for abdominal pain.  Genitourinary: Positive for flank pain.  All other systems reviewed and are negative.     Allergies  Hydrocodone  Home Medications   Prior to Admission medications   Medication Sig Start Date End Date Taking? Authorizing Provider  Prenatal Vit-Fe Fumarate-FA (PRENATAL MULTIVITAMIN) TABS Take 1 tablet by mouth at bedtime.    Historical Provider, MD   BP 114/71  Pulse 96   Temp(Src) 97.5 F (36.4 C) (Oral)  Resp 18  Ht 5\' 3"  (1.6 m)  Wt 101 lb (45.813 kg)  BMI 17.90 kg/m2  SpO2 100% Physical Exam  Nursing note and vitals reviewed. Constitutional: She is oriented to person, place, and time. She appears well-developed and well-nourished.  HENT:  Head: Normocephalic.  Eyes: EOM are normal. Pupils are equal, round, and reactive to light.  Neck: Normal range of motion.  Cardiovascular: Normal rate.   Pulmonary/Chest: Effort normal.  Abdominal: Soft. She exhibits no distension. There is no tenderness. There is no rebound and no guarding.  Musculoskeletal: Normal range of motion.  Neurological: She is alert and oriented to person, place, and time.  Skin: Skin is warm.  Psychiatric: She has a normal mood and affect.    ED Course  Procedures (including critical care time) Labs Review Labs Reviewed - No data to display  Imaging Review No results found.   EKG Interpretation None     No hydronephrosis.   Pt feels better after pain medication.   Pt has seen Dr. Brunilda PayorNesi in the past.  Pt advised to follow up with Dr. Brunilda Payornesi and Dr. Dareen Pianoanderson MDM   Final diagnoses:  Left upper quadrant pain        Elson AreasLeslie K Sofia, PA-C 07/12/14 0002  Lonia SkinnerLeslie K CowenSofia, PA-C 07/12/14 0003

## 2014-07-11 NOTE — ED Notes (Signed)
Left side abd and flank pain started last night-[redacted] weeks pregnant with US yesterday-cramping started after exam and food yesterday-pt also has hx of kidney stones

## 2014-07-11 NOTE — ED Notes (Signed)
Patient transported to Ultrasound 

## 2014-07-13 NOTE — ED Provider Notes (Signed)
Medical screening examination/treatment/procedure(s) were performed by non-physician practitioner and as supervising physician I was immediately available for consultation/collaboration.  Gilda Creasehristopher J. Brent Noto, MD 07/13/14 [phone]

## 2014-08-10 LAB — OB RESULTS CONSOLE RPR: RPR: NONREACTIVE

## 2014-08-10 LAB — OB RESULTS CONSOLE PLATELET COUNT: Platelets: 247 10*3/uL

## 2014-08-10 LAB — OB RESULTS CONSOLE HIV ANTIBODY (ROUTINE TESTING): HIV: NONREACTIVE

## 2014-08-10 LAB — OB RESULTS CONSOLE HGB/HCT, BLOOD
HCT: 41 %
Hemoglobin: 13.8 g/dL

## 2014-10-02 ENCOUNTER — Encounter (HOSPITAL_BASED_OUTPATIENT_CLINIC_OR_DEPARTMENT_OTHER): Payer: Self-pay | Admitting: Emergency Medicine

## 2014-10-05 ENCOUNTER — Inpatient Hospital Stay (HOSPITAL_COMMUNITY)
Admission: AD | Admit: 2014-10-05 | Payer: Managed Care, Other (non HMO) | Source: Ambulatory Visit | Admitting: Obstetrics and Gynecology

## 2014-12-01 NOTE — L&D Delivery Note (Signed)
Delivery Note Patient pushed for 10 minutes and at 8:06 PM a viable and healthy female was delivered via Vaginal, Spontaneous Delivery (Presentation: Right Occiput Anterior).  APGAR: 9, 9; weight  .   Placenta status: Intact, .  Cord: 3 vessels with the following complications: None.   Anesthesia: Epidural  Episiotomy: None Lacerations:2nd degree Suture Repair: 2.0 vicryl Est. Blood Loss (mL): 300  Mom to postpartum.  Baby to Couplet care / Skin to Skin.  Carrie Combs, Carrie Combs STACIA 03/06/2015, 8:44 PM

## 2015-01-30 LAB — OB RESULTS CONSOLE GBS: STREP GROUP B AG: NEGATIVE

## 2015-03-06 ENCOUNTER — Inpatient Hospital Stay (HOSPITAL_COMMUNITY): Payer: Managed Care, Other (non HMO) | Admitting: Anesthesiology

## 2015-03-06 ENCOUNTER — Inpatient Hospital Stay (HOSPITAL_COMMUNITY)
Admission: AD | Admit: 2015-03-06 | Discharge: 2015-03-08 | DRG: 775 | Disposition: A | Discharge status: Home / Self Care | Payer: Managed Care, Other (non HMO) | Source: Ambulatory Visit | Attending: Obstetrics & Gynecology | Admitting: Obstetrics & Gynecology

## 2015-03-06 ENCOUNTER — Encounter (HOSPITAL_COMMUNITY): Payer: Self-pay

## 2015-03-06 DIAGNOSIS — Z3A40 40 weeks gestation of pregnancy: Secondary | ICD-10-CM | POA: Diagnosis present

## 2015-03-06 DIAGNOSIS — Z3483 Encounter for supervision of other normal pregnancy, third trimester: Secondary | ICD-10-CM | POA: Diagnosis present

## 2015-03-06 DIAGNOSIS — O480 Post-term pregnancy: Principal | ICD-10-CM | POA: Diagnosis present

## 2015-03-06 LAB — TYPE AND SCREEN
ABO/RH(D): A POS
ANTIBODY SCREEN: NEGATIVE

## 2015-03-06 LAB — CBC
HCT: 34.7 % — ABNORMAL LOW (ref 36.0–46.0)
Hemoglobin: 11.3 g/dL — ABNORMAL LOW (ref 12.0–15.0)
MCH: 28 pg (ref 26.0–34.0)
MCHC: 32.6 g/dL (ref 30.0–36.0)
MCV: 86.1 fL (ref 78.0–100.0)
PLATELETS: 186 10*3/uL (ref 150–400)
RBC: 4.03 MIL/uL (ref 3.87–5.11)
RDW: 13.3 % (ref 11.5–15.5)
WBC: 13 10*3/uL — AB (ref 4.0–10.5)

## 2015-03-06 LAB — ABO/RH: ABO/RH(D): A POS

## 2015-03-06 LAB — RPR: RPR Ser Ql: NONREACTIVE

## 2015-03-06 MED ORDER — BUTORPHANOL TARTRATE 1 MG/ML IJ SOLN: 1.0000 mg | INTRAMUSCULAR | Status: DC | PRN

## 2015-03-06 MED ORDER — OXYCODONE-ACETAMINOPHEN 5-325 MG PO TABS
1.0000 | ORAL_TABLET | ORAL | Status: DC | PRN
  Administered 2015-03-07 – 2015-03-08 (×2): 1 via ORAL
  Filled 2015-03-06 (×2): qty 1

## 2015-03-06 MED ORDER — ACETAMINOPHEN 325 MG PO TABS: 650.0000 mg | ORAL_TABLET | ORAL | Status: DC | PRN

## 2015-03-06 MED ORDER — LIDOCAINE HCL (PF) 1 % IJ SOLN
INTRAMUSCULAR | Status: DC | PRN
  Administered 2015-03-06 (×2): 4 mL

## 2015-03-06 MED ORDER — CITRIC ACID-SODIUM CITRATE 334-500 MG/5ML PO SOLN: 30.0000 mL | ORAL | Status: DC | PRN

## 2015-03-06 MED ORDER — FLEET ENEMA 7-19 GM/118ML RE ENEM: 1.0000 | ENEMA | RECTAL | Status: DC | PRN

## 2015-03-06 MED ORDER — LACTATED RINGERS IV SOLN: 500.0000 mL | INTRAVENOUS | Status: DC | PRN

## 2015-03-06 MED ORDER — MISOPROSTOL 25 MCG QUARTER TABLET
25.0000 ug | ORAL_TABLET | ORAL | Status: DC | PRN
  Administered 2015-03-06: 25 ug via VAGINAL
  Filled 2015-03-06: qty 0.25

## 2015-03-06 MED ORDER — EPHEDRINE 5 MG/ML INJ: 10.0000 mg | INTRAVENOUS | Status: DC | PRN

## 2015-03-06 MED ORDER — ZOLPIDEM TARTRATE 5 MG PO TABS: 5.0000 mg | ORAL_TABLET | Freq: Every evening | ORAL | Status: DC | PRN

## 2015-03-06 MED ORDER — FENTANYL 2.5 MCG/ML BUPIVACAINE 1/10 % EPIDURAL INFUSION (WH - ANES)
14.0000 mL/h | INTRAMUSCULAR | Status: DC | PRN
  Administered 2015-03-06: 14 mL/h via EPIDURAL
  Filled 2015-03-06: qty 125

## 2015-03-06 MED ORDER — OXYTOCIN 40 UNITS IN LACTATED RINGERS INFUSION - SIMPLE MED
1.0000 m[IU]/min | INTRAVENOUS | Status: DC
Start: 2015-03-06 — End: 2015-03-06
  Administered 2015-03-06: 2 m[IU]/min via INTRAVENOUS
  Filled 2015-03-06: qty 1000

## 2015-03-06 MED ORDER — FENTANYL 2.5 MCG/ML BUPIVACAINE 1/10 % EPIDURAL INFUSION (WH - ANES)
INTRAMUSCULAR | Status: DC | PRN
  Administered 2015-03-06: 14 mL/h via EPIDURAL

## 2015-03-06 MED ORDER — SIMETHICONE 80 MG PO CHEW: 80.0000 mg | CHEWABLE_TABLET | ORAL | Status: DC | PRN

## 2015-03-06 MED ORDER — OXYTOCIN BOLUS FROM INFUSION: 500.0000 mL | INTRAVENOUS | Status: DC

## 2015-03-06 MED ORDER — PHENYLEPHRINE 40 MCG/ML (10ML) SYRINGE FOR IV PUSH (FOR BLOOD PRESSURE SUPPORT)
80.0000 ug | PREFILLED_SYRINGE | INTRAVENOUS | Status: DC | PRN
  Filled 2015-03-06: qty 20

## 2015-03-06 MED ORDER — BENZOCAINE-MENTHOL 20-0.5 % EX AERO
1.0000 "application " | INHALATION_SPRAY | CUTANEOUS | Status: DC | PRN
  Administered 2015-03-06: 1 via TOPICAL
  Filled 2015-03-06: qty 56

## 2015-03-06 MED ORDER — ACETAMINOPHEN 325 MG PO TABS
650.0000 mg | ORAL_TABLET | ORAL | Status: DC | PRN
  Administered 2015-03-06 – 2015-03-07 (×2): 650 mg via ORAL
  Administered 2015-03-07: 325 mg via ORAL
  Administered 2015-03-07 (×2): 650 mg via ORAL
  Filled 2015-03-06 (×5): qty 2

## 2015-03-06 MED ORDER — TETANUS-DIPHTH-ACELL PERTUSSIS 5-2.5-18.5 LF-MCG/0.5 IM SUSP: 0.5000 mL | Freq: Once | INTRAMUSCULAR | Status: DC

## 2015-03-06 MED ORDER — TERBUTALINE SULFATE 1 MG/ML IJ SOLN: 0.2500 mg | Freq: Once | INTRAMUSCULAR | Status: DC | PRN

## 2015-03-06 MED ORDER — DIBUCAINE 1 % RE OINT: 1.0000 "application " | TOPICAL_OINTMENT | RECTAL | Status: DC | PRN

## 2015-03-06 MED ORDER — WITCH HAZEL-GLYCERIN EX PADS: 1.0000 "application " | MEDICATED_PAD | CUTANEOUS | Status: DC | PRN

## 2015-03-06 MED ORDER — PRENATAL MULTIVITAMIN CH
1.0000 | ORAL_TABLET | Freq: Every day | ORAL | Status: DC
Start: 2015-03-07 — End: 2015-03-08
  Administered 2015-03-07: 1 via ORAL
  Filled 2015-03-06: qty 1

## 2015-03-06 MED ORDER — ONDANSETRON HCL 4 MG/2ML IJ SOLN
4.0000 mg | INTRAMUSCULAR | Status: DC | PRN
  Administered 2015-03-06: 4 mg via INTRAVENOUS
  Filled 2015-03-06: qty 2

## 2015-03-06 MED ORDER — LACTATED RINGERS IV SOLN
500.0000 mL | Freq: Once | INTRAVENOUS | Status: AC
  Administered 2015-03-06: 500 mL via INTRAVENOUS

## 2015-03-06 MED ORDER — PHENYLEPHRINE 40 MCG/ML (10ML) SYRINGE FOR IV PUSH (FOR BLOOD PRESSURE SUPPORT): 80.0000 ug | PREFILLED_SYRINGE | INTRAVENOUS | Status: DC | PRN

## 2015-03-06 MED ORDER — ONDANSETRON HCL 4 MG/2ML IJ SOLN: 4.0000 mg | Freq: Four times a day (QID) | INTRAMUSCULAR | Status: DC | PRN

## 2015-03-06 MED ORDER — LANOLIN HYDROUS EX OINT: TOPICAL_OINTMENT | CUTANEOUS | Status: DC | PRN

## 2015-03-06 MED ORDER — IBUPROFEN 600 MG PO TABS
600.0000 mg | ORAL_TABLET | Freq: Four times a day (QID) | ORAL | Status: DC
  Filled 2015-03-06: qty 1

## 2015-03-06 MED ORDER — DIPHENHYDRAMINE HCL 50 MG/ML IJ SOLN: 12.5000 mg | INTRAMUSCULAR | Status: DC | PRN

## 2015-03-06 MED ORDER — ONDANSETRON HCL 4 MG PO TABS: 4.0000 mg | ORAL_TABLET | ORAL | Status: DC | PRN

## 2015-03-06 MED ORDER — LIDOCAINE HCL (PF) 1 % IJ SOLN
30.0000 mL | INTRAMUSCULAR | Status: DC | PRN
  Filled 2015-03-06: qty 30

## 2015-03-06 MED ORDER — OXYTOCIN 40 UNITS IN LACTATED RINGERS INFUSION - SIMPLE MED: 62.5000 mL/h | INTRAVENOUS | Status: DC

## 2015-03-06 MED ORDER — SENNOSIDES-DOCUSATE SODIUM 8.6-50 MG PO TABS
2.0000 | ORAL_TABLET | ORAL | Status: DC
  Administered 2015-03-08: 2 via ORAL
  Filled 2015-03-06: qty 2

## 2015-03-06 MED ORDER — LACTATED RINGERS IV SOLN
INTRAVENOUS | Status: DC
  Administered 2015-03-06: 08:00:00 via INTRAVENOUS

## 2015-03-06 MED ORDER — DIPHENHYDRAMINE HCL 25 MG PO CAPS: 25.0000 mg | ORAL_CAPSULE | Freq: Four times a day (QID) | ORAL | Status: DC | PRN

## 2015-03-06 MED ORDER — OXYCODONE-ACETAMINOPHEN 5-325 MG PO TABS: 2.0000 | ORAL_TABLET | ORAL | Status: DC | PRN

## 2015-03-06 NOTE — Anesthesia Procedure Notes (Signed)
Epidural Patient location during procedure: OB Start time: 03/06/2015 4:30 PM  Staffing Anesthesiologist: Karie SchwalbeJUDD, Aranda Bihm Performed by: anesthesiologist   Preanesthetic Checklist Completed: patient identified, site marked, surgical consent, pre-op evaluation, timeout performed, IV checked, risks and benefits discussed and monitors and equipment checked  Epidural Patient position: sitting Prep: site prepped and draped and DuraPrep Patient monitoring: continuous pulse ox and blood pressure Approach: midline Location: L3-L4 Injection technique: LOR saline  Needle:  Needle type: Tuohy  Needle gauge: 17 G Needle length: 9 cm and 9 Needle insertion depth: 4 cm Catheter type: closed end flexible Catheter size: 19 Gauge Catheter at skin depth: 9 cm Test dose: negative  Assessment Events: blood not aspirated, injection not painful, no injection resistance, negative IV test and no paresthesia  Additional Notes Patient identified. Risks/Benefits/Options discussed with patient including but not limited to bleeding, infection, nerve damage, paralysis, failed block, incomplete pain control, headache, blood pressure changes, nausea, vomiting, reactions to medication both or allergic, itching and postpartum back pain. Confirmed with bedside nurse the patient's most recent platelet count. Confirmed with patient that they are not currently taking any anticoagulation, have any bleeding history or any family history of bleeding disorders. Patient expressed understanding and wished to proceed. All questions were answered. Sterile technique was used throughout the entire procedure. Please see nursing notes for vital signs. Test dose was given through epidural catheter and negative prior to continuing to dose epidural or start infusion. Warning signs of high block given to the patient including shortness of breath, tingling/numbness in hands, complete motor block, or any concerning symptoms with instructions to  call for help. Patient was given instructions on fall risk and not to get out of bed. All questions and concerns addressed with instructions to call with any issues or inadequate analgesia.

## 2015-03-06 NOTE — Anesthesia Preprocedure Evaluation (Signed)
Anesthesia Evaluation  Patient identified by MRN, date of birth, ID band Patient awake    Reviewed: Allergy & Precautions, NPO status , Patient's Chart, lab work & pertinent test results  History of Anesthesia Complications Negative for: history of anesthetic complications  Airway Mallampati: II  TM Distance: >3 FB Neck ROM: Full    Dental no notable dental hx. (+) Dental Advisory Given   Pulmonary asthma ,  breath sounds clear to auscultation  Pulmonary exam normal       Cardiovascular negative cardio ROS  Rhythm:Regular Rate:Normal     Neuro/Psych  Headaches, negative psych ROS   GI/Hepatic negative GI ROS, Neg liver ROS,   Endo/Other  negative endocrine ROS  Renal/GU negative Renal ROS  negative genitourinary   Musculoskeletal negative musculoskeletal ROS (+)   Abdominal   Peds negative pediatric ROS (+)  Hematology negative hematology ROS (+)   Anesthesia Other Findings   Reproductive/Obstetrics (+) Pregnancy                             Anesthesia Physical Anesthesia Plan  ASA: II  Anesthesia Plan: Epidural   Post-op Pain Management:    Induction:   Airway Management Planned:   Additional Equipment:   Intra-op Plan:   Post-operative Plan:   Informed Consent: I have reviewed the patients History and Physical, chart, labs and discussed the procedure including the risks, benefits and alternatives for the proposed anesthesia with the patient or authorized representative who has indicated his/her understanding and acceptance.   Dental advisory given  Plan Discussed with: Anesthesiologist  Anesthesia Plan Comments:         Anesthesia Quick Evaluation

## 2015-03-06 NOTE — H&P (Signed)
Carrie Combs is a 29 y.o. female G2P1 presenting for elective IOL at 40 weeks 2 days. She has irregular ctx, no vaginal bleeding no LOF notes good FM.   Maternal Medical History:  Reason for admission: Contractions.  Elective IOL at term  Contractions: Onset was 6-12 hours ago.   Frequency: irregular.   Duration is approximately 60 seconds.   Perceived severity is mild.    Fetal activity: Perceived fetal activity is normal.   Last perceived fetal movement was within the past 12 hours.    Prenatal complications: no prenatal complications Prenatal Complications - Diabetes: none.    OB History    Gravida Para Term Preterm AB TAB SAB Ectopic Multiple Living   0 0 0 0 0 1 1     Past Medical History  Diagnosis Date  . Migraine   . History of kidney stones   . Asthma     exercise-induced   Past Surgical History  Procedure Laterality Date  . Cholecystectomy    . Kidney stone surgery    . Wisdom tooth extraction     Family History: family history includes Cancer in her paternal grandfather. There is no history of Other. Social History:  reports that she has never smoked. She has never used smokeless tobacco. She reports that she does not drink alcohol or use illicit drugs.   Prenatal Transfer Tool  Maternal Diabetes: No Genetic Screening: Normal Maternal Ultrasounds/Referrals: Normal Fetal Ultrasounds or other Referrals:  Other: anatomy US normal Maternal Substance Abuse:  No Significant Maternal Medications:  None Significant Maternal Lab Results:  Lab values include: Group B Strep negative Other Comments:  None  Review of Systems  Constitutional: Negative.  Negative for fever, chills and weight loss.  Eyes: Negative.  Negative for blurred vision.  Respiratory: Negative for cough.   Cardiovascular: Negative for chest pain and palpitations.  Gastrointestinal: Negative.   Genitourinary: Negative for dysuria.  Skin: Negative for itching and rash.  Neurological:  Negative for dizziness, tingling and headaches.  Endo/Heme/Allergies: Negative.   Psychiatric/Behavioral: Negative for depression.  All other systems reviewed and are negative.   Dilation: 1.5 (cytotec placed) Effacement (%): 50 Station: -2 Exam by:: Renaldo Harrison, RNC Blood pressure 128/86, pulse 93, temperature 98.2 F (36.8 C), temperature source Oral, resp. rate 18, height  (1.6 m), weight 61.689 kg (136 lb), unknown if currently breastfeeding. Maternal Exam:  Uterine Assessment: Contraction strength is mild.  Contraction duration is 60 seconds. Contraction frequency is irregular.   Abdomen: Patient reports no abdominal tenderness. Fundal height is 40 cm.   Estimated fetal weight is 3600 grams.   Fetal presentation: vertex  Introitus: Normal vulva. Normal vagina.  Ferning test: not done.  Nitrazine test: not done. Amniotic fluid character: not assessed.  Pelvis: adequate for delivery.   Cervix: Cervix evaluated by digital exam.   2/50/-3  Fetal Exam Fetal Monitor Review: Mode: hand-held doppler probe.   Baseline rate: 140.  Variability: moderate (6-25 bpm).   Pattern: accelerations present and no decelerations.    Fetal State Assessment: Category I - tracings are normal.     Physical Exam  Nursing note and vitals reviewed. Constitutional: She is oriented to person, place, and time. She appears well-developed and well-nourished.  HENT:  Head: Normocephalic and atraumatic.  Eyes: Pupils are equal, round, and reactive to light.  Neck: Normal range of motion.  Cardiovascular: Normal rate and normal heart sounds.   Respiratory: Effort normal.  GI: Soft. Bowel sounds  are normal.  Musculoskeletal: Normal range of motion.  Neurological: She is alert and oriented to person, place, and time. She has normal reflexes.  Skin: Skin is warm.  Psychiatric: She has a normal mood and affect. Her behavior is normal.    Prenatal labs: ABO, Rh:  A pos Antibody:  neg Rubella:   Immune RPR: Nonreactive (09/10 0000)  HBsAg:   NR HIV: Non-reactive (09/10 0000)  GBS: Negative (03/01 0000)   Assessment/Plan: 29 yo G2P1 at 40 weeks 2 days for induction of labor Misoprostol for ripening x 1 then pitocin for augmentation Epidural on demand Active management of labor   Essie HartINN, Moustapha Tooker STACIA 03/06/2015, 8:51 AM

## 2015-03-07 LAB — CBC
HCT: 28 % — ABNORMAL LOW (ref 36.0–46.0)
HEMOGLOBIN: 9.1 g/dL — AB (ref 12.0–15.0)
MCH: 28 pg (ref 26.0–34.0)
MCHC: 32.5 g/dL (ref 30.0–36.0)
MCV: 86.2 fL (ref 78.0–100.0)
PLATELETS: 160 10*3/uL (ref 150–400)
RBC: 3.25 MIL/uL — AB (ref 3.87–5.11)
RDW: 13.1 % (ref 11.5–15.5)
WBC: 16.5 10*3/uL — AB (ref 4.0–10.5)

## 2015-03-07 NOTE — Progress Notes (Signed)
PPD# 1 Pt without c/o. VSSAF IMP/stable Plan/ routine care. 

## 2015-03-07 NOTE — Anesthesia Postprocedure Evaluation (Signed)
  Anesthesia Post-op Note  Patient: Carrie Combs  Procedure(s) Performed: * No procedures listed *  Patient Location: Mother/Baby  Anesthesia Type:Epidural  Level of Consciousness: awake, alert  and oriented  Airway and Oxygen Therapy: Patient Spontanous Breathing  Post-op Pain: none  Post-op Assessment: Post-op Vital signs reviewed and Patient's Cardiovascular Status Stable  Post-op Vital Signs: Reviewed and stable  Last Vitals:  Filed Vitals:   03/07/15 0433  BP: 115/74  Pulse: 84  Temp: 36.8 C  Resp: 20    Complications: No apparent anesthesia complications

## 2015-03-07 NOTE — Lactation Note (Signed)
This note was copied from the chart of Carrie Ezequiel Kayserrynn Camus. Lactation Consultation Note; Experienced BF mom. Reports baby has been nursing great. Has baby to the breast when I went into room but mostly sleeping at present. Mom reports she has been on and off the breast most of the morning. Reports she is doing much better than her first baby. No questions at present. Bf brochure given with resources for support after DC. To call prn  Patient Name: Carrie Combs ZOXWR'UToday's Date: 03/07/2015 Reason for consult: Initial assessment   Maternal Data Formula Feeding for Exclusion: No Does the patient have breastfeeding experience prior to this delivery?: Yes  Feeding   LATCH Score/Interventions Latch: Too sleepy or reluctant, no latch achieved, no sucking elicited.  Audible Swallowing: None  Type of Nipple: Everted at rest and after stimulation  Comfort (Breast/Nipple): Soft / non-tender     Hold (Positioning): No assistance needed to correctly position infant at breast.  LATCH Score: 6  Lactation Tools Discussed/Used     Consult Status Consult Status: PRN    Pamelia HoitWeeks, Bearl Talarico D 03/07/2015, 1:32 PM

## 2015-03-08 MED ORDER — DOCUSATE SODIUM 100 MG PO CAPS: 100.0000 mg | ORAL_CAPSULE | Freq: Two times a day (BID) | ORAL | Status: DC

## 2015-03-08 MED ORDER — IBUPROFEN 600 MG PO TABS: 600.0000 mg | ORAL_TABLET | Freq: Four times a day (QID) | ORAL | Status: DC | PRN

## 2015-03-08 MED ORDER — OXYCODONE-ACETAMINOPHEN 5-325 MG PO TABS: 2.0000 | ORAL_TABLET | ORAL | Status: DC | PRN

## 2015-03-08 NOTE — Discharge Summary (Signed)
Obstetric Discharge Summary Reason for Admission: induction of labor Prenatal Procedures: NST and ultrasound Intrapartum Procedures: spontaneous vaginal delivery Postpartum Procedures: none Complications-Operative and Postpartum: 2nd degree perineal laceration HEMOGLOBIN  Date Value Ref Range Status  03/07/2015 9.1* 12.0 - 15.0 g/dL Final    Comment:    DELTA CHECK NOTED REPEATED TO VERIFY   08/10/2014 13.8 g/dL Final   HCT  Date Value Ref Range Status  03/07/2015 28.0* 36.0 - 46.0 % Final  08/10/2014 41 % Final    Physical Exam:  General: alert, cooperative and appears stated age 61Lochia: appropriate Uterine Fundus: firm  Discharge Diagnoses: Term Pregnancy-delivered  Discharge Information: Date: 03/08/2015 Activity: pelvic rest Diet: routine Medications: Ibuprofen, Colace and Percocet Condition: improved Instructions: refer to practice specific booklet Discharge to: home Follow-up Information    [facility], Sanjuana MaeWALDA STACIA, MD.   Specialty:  Obstetrics and Gynecology   Why:  4-6 weeks for a postpartum evalaution   Contact information:   [address] [place] [id number] [phone]       Newborn Data: Live born female  Birth Weight: 6 lb 15.8 oz (3170 g) APGAR: 9, 9  Home with mother.  Ginia Rudell H. 03/08/2015, 9:13 AM

## 2015-11-14 ENCOUNTER — Emergency Department (HOSPITAL_BASED_OUTPATIENT_CLINIC_OR_DEPARTMENT_OTHER): Payer: Managed Care, Other (non HMO)

## 2015-11-14 ENCOUNTER — Emergency Department (HOSPITAL_BASED_OUTPATIENT_CLINIC_OR_DEPARTMENT_OTHER)
Admission: EM | Admit: 2015-11-14 | Discharge: 2015-11-14 | Disposition: A | Discharge status: Home / Self Care | Payer: Managed Care, Other (non HMO) | Attending: Emergency Medicine | Admitting: Emergency Medicine

## 2015-11-14 ENCOUNTER — Encounter (HOSPITAL_BASED_OUTPATIENT_CLINIC_OR_DEPARTMENT_OTHER): Payer: Self-pay | Admitting: Emergency Medicine

## 2015-11-14 DIAGNOSIS — Z8679 Personal history of other diseases of the circulatory system: Secondary | ICD-10-CM | POA: Diagnosis not present

## 2015-11-14 DIAGNOSIS — R740 Nonspecific elevation of levels of transaminase and lactic acid dehydrogenase [LDH]: Secondary | ICD-10-CM | POA: Insufficient documentation

## 2015-11-14 DIAGNOSIS — R7989 Other specified abnormal findings of blood chemistry: Secondary | ICD-10-CM

## 2015-11-14 DIAGNOSIS — R1013 Epigastric pain: Secondary | ICD-10-CM | POA: Diagnosis present

## 2015-11-14 DIAGNOSIS — Z79899 Other long term (current) drug therapy: Secondary | ICD-10-CM | POA: Insufficient documentation

## 2015-11-14 DIAGNOSIS — R109 Unspecified abdominal pain: Secondary | ICD-10-CM

## 2015-11-14 DIAGNOSIS — Z87442 Personal history of urinary calculi: Secondary | ICD-10-CM | POA: Insufficient documentation

## 2015-11-14 DIAGNOSIS — K5901 Slow transit constipation: Secondary | ICD-10-CM | POA: Diagnosis not present

## 2015-11-14 DIAGNOSIS — R112 Nausea with vomiting, unspecified: Secondary | ICD-10-CM | POA: Insufficient documentation

## 2015-11-14 DIAGNOSIS — J45909 Unspecified asthma, uncomplicated: Secondary | ICD-10-CM | POA: Insufficient documentation

## 2015-11-14 DIAGNOSIS — R7401 Elevation of levels of liver transaminase levels: Secondary | ICD-10-CM

## 2015-11-14 DIAGNOSIS — R945 Abnormal results of liver function studies: Secondary | ICD-10-CM

## 2015-11-14 LAB — URINE MICROSCOPIC-ADD ON: RBC / HPF: NONE SEEN RBC/hpf (ref 0–5)

## 2015-11-14 LAB — CBC WITH DIFFERENTIAL/PLATELET
BASOS ABS: 0 10*3/uL (ref 0.0–0.1)
Basophils Relative: 0 %
EOS PCT: 0 %
Eosinophils Absolute: 0 10*3/uL (ref 0.0–0.7)
HEMATOCRIT: 41.4 % (ref 36.0–46.0)
Hemoglobin: 13.6 g/dL (ref 12.0–15.0)
Lymphocytes Relative: 9 %
Lymphs Abs: 1.1 10*3/uL (ref 0.7–4.0)
MCH: 29.3 pg (ref 26.0–34.0)
MCHC: 32.9 g/dL (ref 30.0–36.0)
MCV: 89.2 fL (ref 78.0–100.0)
Monocytes Absolute: 0.6 10*3/uL (ref 0.1–1.0)
Monocytes Relative: 6 %
NEUTROS ABS: 10 10*3/uL — AB (ref 1.7–7.7)
Neutrophils Relative %: 85 %
Platelets: 283 10*3/uL (ref 150–400)
RBC: 4.64 MIL/uL (ref 3.87–5.11)
RDW: 11.8 % (ref 11.5–15.5)
WBC: 11.7 10*3/uL — ABNORMAL HIGH (ref 4.0–10.5)

## 2015-11-14 LAB — COMPREHENSIVE METABOLIC PANEL
ALT: 250 U/L — ABNORMAL HIGH (ref 14–54)
ANION GAP: 8 (ref 5–15)
AST: 450 U/L — ABNORMAL HIGH (ref 15–41)
Albumin: 4.9 g/dL (ref 3.5–5.0)
Alkaline Phosphatase: 147 U/L — ABNORMAL HIGH (ref 38–126)
BILIRUBIN TOTAL: 1.5 mg/dL — AB (ref 0.3–1.2)
BUN: 12 mg/dL (ref 6–20)
CO2: 27 mmol/L (ref 22–32)
Calcium: 9.4 mg/dL (ref 8.9–10.3)
Chloride: 101 mmol/L (ref 101–111)
Creatinine, Ser: 0.4 mg/dL — ABNORMAL LOW (ref 0.44–1.00)
GFR calc non Af Amer: >60 mL/min (ref >60)
Glucose, Bld: 109 mg/dL — ABNORMAL HIGH (ref 65–99)
POTASSIUM: 3.8 mmol/L (ref 3.5–5.1)
Sodium: 136 mmol/L (ref 135–145)
TOTAL PROTEIN: 8.2 g/dL — AB (ref 6.5–8.1)

## 2015-11-14 LAB — URINALYSIS, ROUTINE W REFLEX MICROSCOPIC
Bilirubin Urine: NEGATIVE
Glucose, UA: NEGATIVE mg/dL
HGB URINE DIPSTICK: NEGATIVE
KETONES UR: NEGATIVE mg/dL
Nitrite: NEGATIVE
PROTEIN: 100 mg/dL — AB
Specific Gravity, Urine: 1.02 (ref 1.005–1.030)
pH: 8.5 — ABNORMAL HIGH (ref 5.0–8.0)

## 2015-11-14 LAB — PREGNANCY, URINE: Preg Test, Ur: NEGATIVE

## 2015-11-14 MED ORDER — MORPHINE SULFATE (PF) 4 MG/ML IV SOLN
4.0000 mg | Freq: Once | INTRAVENOUS | Status: AC
  Administered 2015-11-14: 4 mg via INTRAVENOUS
  Filled 2015-11-14: qty 1

## 2015-11-14 MED ORDER — ONDANSETRON 4 MG PO TBDP: 4.0000 mg | ORAL_TABLET | Freq: Three times a day (TID) | ORAL | Status: DC | PRN

## 2015-11-14 MED ORDER — KETOROLAC TROMETHAMINE 30 MG/ML IJ SOLN
30.0000 mg | Freq: Once | INTRAMUSCULAR | Status: AC
  Administered 2015-11-14: 30 mg via INTRAVENOUS
  Filled 2015-11-14: qty 1

## 2015-11-14 MED ORDER — ONDANSETRON HCL 4 MG/2ML IJ SOLN
4.0000 mg | INTRAMUSCULAR | Status: AC | PRN
  Administered 2015-11-14 (×2): 4 mg via INTRAVENOUS
  Filled 2015-11-14 (×2): qty 2

## 2015-11-14 MED ORDER — SODIUM CHLORIDE 0.9 % IV BOLUS (SEPSIS)
1000.0000 mL | Freq: Once | INTRAVENOUS | Status: AC
  Administered 2015-11-14: 1000 mL via INTRAVENOUS

## 2015-11-14 MED ORDER — POLYETHYLENE GLYCOL 3350 17 GM/SCOOP PO POWD: ORAL | Status: DC

## 2015-11-14 NOTE — ED Notes (Signed)
Pt inquiring about IV fluids.  Dr. Clydene PughKnott aware and advised of no IV fluids at this time.   Pending lab test results.  Pt states nausea and pain improved but that she feels "hot."  Decreased temperature in room by 2 degress F for pt comfort.

## 2015-11-14 NOTE — Discharge Instructions (Signed)
Constipation, Adult °Constipation is when a person has fewer than three bowel movements a week, has difficulty having a bowel movement, or has stools that are dry, hard, or larger than normal. As people grow older, constipation is more common. A low-fiber diet, not taking in enough fluids, and taking certain medicines may make constipation worse.  °CAUSES  °· Certain medicines, such as antidepressants, pain medicine, iron supplements, antacids, and water pills.   °· Certain diseases, such as diabetes, irritable bowel syndrome (IBS), thyroid disease, or depression.   °· Not drinking enough water.   °· Not eating enough fiber-rich foods.   °· Stress or travel.   °· Lack of physical activity or exercise.   °· Ignoring the urge to have a bowel movement.   °· Using laxatives too much.   °SIGNS AND SYMPTOMS  °· Having fewer than three bowel movements a week.   °· Straining to have a bowel movement.   °· Having stools that are hard, dry, or larger than normal.   °· Feeling full or bloated.   °· Pain in the lower abdomen.   °· Not feeling relief after having a bowel movement.   °DIAGNOSIS  °Your health care provider will take a medical history and perform a physical exam. Further testing may be done for severe constipation. Some tests may include: °· A barium enema X-ray to examine your rectum, colon, and, sometimes, your small intestine.   °· A sigmoidoscopy to examine your lower colon.   °· A colonoscopy to examine your entire colon. °TREATMENT  °Treatment will depend on the severity of your constipation and what is causing it. Some dietary treatments include drinking more fluids and eating more fiber-rich foods. Lifestyle treatments may include regular exercise. If these diet and lifestyle recommendations do not help, your health care provider may recommend taking over-the-counter laxative medicines to help you have bowel movements. Prescription medicines may be prescribed if over-the-counter medicines do not work.    °HOME CARE INSTRUCTIONS  °· Eat foods that have a lot of fiber, such as fruits, vegetables, whole grains, and beans. °· Limit foods high in fat and processed sugars, such as french fries, hamburgers, cookies, candies, and soda.   °· A fiber supplement may be added to your diet if you cannot get enough fiber from foods.   °· Drink enough fluids to keep your urine clear or pale yellow.   °· Exercise regularly or as directed by your health care provider.   °· Go to the restroom when you have the urge to go. Do not hold it.   °· Only take over-the-counter or prescription medicines as directed by your health care provider. Do not take other medicines for constipation without talking to your health care provider first.   °SEEK IMMEDIATE MEDICAL CARE IF:  °· You have bright red blood in your stool.   °· Your constipation lasts for more than 4 days or gets worse.   °· You have abdominal or rectal pain.   °· You have thin, pencil-like stools.   °· You have unexplained weight loss. °MAKE SURE YOU:  °· Understand these instructions. °· Will watch your condition. °· Will get help right away if you are not doing well or get worse. °  °This information is not intended to replace advice given to you by your health care provider. Make sure you discuss any questions you have with your health care provider. °  °Document Released: 08/15/2004 Document Revised: 12/08/2014 Document Reviewed: 08/29/2013 °Elsevier Interactive Patient Education ©2016 [employer] ° °Abdominal Pain, Adult °Many things can cause abdominal pain. Usually, abdominal pain is not caused by a disease and   will improve without treatment. It can often be observed and treated at home. Your health care provider will do a physical exam and possibly order blood tests and X-rays to help determine the seriousness of your pain. However, in many cases, more time must pass before a clear cause of the pain can be found. Before that point, your health care provider may not  know if you need more testing or further treatment. HOME CARE INSTRUCTIONS Monitor your abdominal pain for any changes. The following actions may help to alleviate any discomfort you are experiencing:  Only take over-the-counter or prescription medicines as directed by your health care provider.  Do not take laxatives unless directed to do so by your health care provider.  Try a clear liquid diet (broth, tea, or water) as directed by your health care provider. Slowly move to a bland diet as tolerated. SEEK MEDICAL CARE IF:  You have unexplained abdominal pain.  You have abdominal pain associated with nausea or diarrhea.  You have pain when you urinate or have a bowel movement.  You experience abdominal pain that wakes you in the night.  You have abdominal pain that is worsened or improved by eating food.  You have abdominal pain that is worsened with eating fatty foods.  You have a fever. SEEK IMMEDIATE MEDICAL CARE IF:  Your pain does not go away within 2 hours.  You keep throwing up (vomiting).  Your pain is felt only in portions of the abdomen, such as the right side or the left lower portion of the abdomen.  You pass bloody or black tarry stools. MAKE SURE YOU:  Understand these instructions.  Will watch your condition.  Will get help right away if you are not doing well or get worse.   This information is not intended to replace advice given to you by your health care provider. Make sure you discuss any questions you have with your health care provider.   Document Released: 08/27/2005 Document Revised: 08/08/2015 Document Reviewed: 07/27/2013 Elsevier Interactive Patient Education 2016 [employer] Liver Function Tests Liver function tests are blood tests to see how well your liver is working. The proteins and enzymes measured in the test can alert your health care provider to inflammation, damage, or disease in your liver. It is common to have liver function  tests:  During annual physical exams.  When you are taking certain medicines.  If you have liver disease.  If you drink a lot of alcohol.  When you are not feeling well.  When you have other conditions that may affect the liver. Substances measured may include:   Alanine transaminase (ALT). This is an enzyme in the liver.  Aspartate transaminase (AST). This is an enzyme in the liver, heart, and muscles.  Alkaline phosphatase (ALP). This is a protein in the liver, bile ducts, and bone. It is also in other body tissues.  Total bilirubin. This is a yellow pigment in bile.  Albumin. This is a protein in the liver.  Prothrombin time and international normalized ratio (PT and INR). PT measures the time that it takes for your blood to clot. INR is a calculation of blood clotting time based upon your PT result. It is also calculated based on normal ranges defined by the laboratory that processed your lab test.  Total protein. This measures two proteins, albumin and globulin, found in the blood. PREPARATION FOR TEST How you prepare will depend on which tests are being done and the reason why these  tests are being done. You may need to:  Avoid eating for 4-6 hours before the test or as directed by your health care provider.  Stop taking certain medicines prior to your blood test as directed by your health care provider. RESULTS  It is your responsibility to obtain your test results. Ask the lab or department performing the test when and how you will get your results. Contact your health care provider to discuss any questions you have about your results. RANGE OF NORMAL VALUES Ranges for normal values may vary among different labs and hospitals. You should always check with your health care provider after having lab work or other tests done to discuss the meaning of your test results and whether your values are considered within normal limits. The following are normal ranges for substances  measured in liver function tests: ALT  Infant: may be twice as high as adult values.  Child or adult: 4-36 international units/L at 37C or 4-36 units/L (SI units).  Elderly: may be slightly higher than adult values. AST  Newborn 26-74 days old: 35-140 units/L.  Child under 42 years old: 15-60 units/L.  65-57 years old: 15-50 units/L.  33-35 years old: 10-50 units/L.  73-54 years old: 10-40 units/L.  Adult: 0-35 units/L or 0-0.58 microkatal/L (SI units).  Elderly: slightly higher than adults. ALP  Child under 19 years old: 85-235 units/L.  65-54 years old: 65-210 units/L.  88-64 years old: 60-300 units/L.  60-44 years old: 30-200 units/L.  Adult: 30-120 units/L or 0.5-2.0 microkatal/L (SI units).  Elderly: slightly higher than adult. Total bilirubin  Newborn: 1.0-12.0 mg/dL or 16.1-096 micromoles/L (SI units).  Adult, elderly, or child: 0.3-1.0 mg/dL or 0.4-54 micromoles/L. Albumin  Premature infant: 3.0-4.2 g/dL.  Newborn: 3.5-5.4 g/dL.  Infant: 4.4-5.4 g/dL.  Child: 4.0-5.9 g/dL.  Adult or elderly: 3.5-5.0 g/dL or 09-81 g/L (SI units). PT  11.0-12.5 seconds; 85%-100%. INR  0.8-1.1. Total protein  Premature infant: 4.2-7.6 g/dL.  Newborn: 4.6-7.4 g/dL.  Infant: 6.0-6.7 g/dL.  Child: 6.2-8.0 g/dL.  Adult or elderly: 6.4-8.3 g/dL or 19-14 g/L (SI units). MEANING OF RESULTS OUTSIDE NORMAL VALUE RANGES Sometimes test results can be abnormal due to other factors, such as medicines, exercise, or pregnancy. Follow up with your health care provider if you have any questions about test results outside the normal value ranges. ALT  Levels above the normal range, along with other test results, may indicate liver disease. AST  Levels above the normal range, along with other test results, may indicate liver disease. Sometimes levels also increase after burns, surgery, heart attack, muscle damage, or seizure. ALP  Levels above the normal range, along with  other test results, may indicate biliary obstruction, diseases of the liver, bone disease, thyroid disease, tumors, fractures, leukemia or lymphoma, or several other conditions. People with blood type O or B may show higher levels after a fatty meal.  Levels below the normal range, along with other test results, may indicate bone and teeth conditions, malnutrition, protein deficiency, or Wilson disease. Total bilirubin  Levels above the normal range, along with other test results, may indicate problems with the liver, gallbladder, or bile ducts. Albumin  Levels above the normal range, along with other test results, may indicate dehydration. They may also be caused by a diet that is high in protein. Sometimes, the band placed around the upper arm during the process of drawing blood can cause the level of this protein in your blood to rise and give you a result  above the normal range.  Levels below the normal range, along with other tests results, may indicate kidney disease, liver disease, or malabsorption of nutrients. PT and INR  Levels above the normal range mean your blood is clotting slower than normal. This may be due to blood disorders, liver disorders, or low levels of vitamin K. Total protein  Levels above the normal range, along with other test results, may be due to infection or other diseases.  Levels below the normal range, along with other test results, may be due to an immune system disorder, bleeding, burns, kidney disorder, liver disease, trouble absorbing or getting enough nutrients, or other conditions that affect the intestines.   This information is not intended to replace advice given to you by your health care provider. Make sure you discuss any questions you have with your health care provider.   Document Released: 12/20/2004 Document Revised: 12/08/2014 Document Reviewed: 03/23/2014 Elsevier Interactive Patient Education [employer]

## 2015-11-14 NOTE — ED Notes (Signed)
Pt awoke from sleep this morning with right lower back pain she describes similar to past experiences with kidney stones.  Pt states she took 1 oxycodone pill and was able to go back to sleep, but then was awoken again a few hours later with returned and worsened pain.  Pain radiates to epigastric area, eased with applied pressure, and pt reports nausea with some vomiting.  Pt denies noted blood in emesis, or urine, and no burning with urination.

## 2015-11-14 NOTE — ED Notes (Signed)
MD at bedside. 

## 2015-11-14 NOTE — ED Notes (Signed)
Pt in [place] at present

## 2015-11-14 NOTE — ED Provider Notes (Signed)
CSN: [id number]     Arrival date & time 11/14/15  0831 History   First MD Initiated Contact with Patient 11/14/15 0845     Chief Complaint  Patient presents with  . Back Pain  . Abdominal Pain     (Consider location/radiation/quality/duration/timing/severity/associated sxs/prior Treatment) Patient is a 29 y.o. female presenting with flank pain. The history is provided by the patient.  Flank Pain This is a new problem. The current episode started 6 to 12 hours ago. The problem occurs constantly. The problem has not changed since onset.Associated symptoms include abdominal pain (epigastric). Nothing aggravates the symptoms. The symptoms are relieved by narcotics. The treatment provided moderate relief.    Past Medical History  Diagnosis Date  . Migraine   . History of kidney stones   . Asthma     exercise-induced   Past Surgical History  Procedure Laterality Date  . Cholecystectomy    . Kidney stone surgery    . Wisdom tooth extraction     Family History  Problem Relation Age of Onset  . Other Neg Hx   . Cancer Paternal Grandfather     liver   Social History  Substance Use Topics  . Smoking status: Never Smoker   . Smokeless tobacco: Never Used  . Alcohol Use: Yes     Comment: occasional   OB History    Gravida Para Term Preterm AB TAB SAB Ectopic Multiple Living   0 0 0 0 0 0 2     Review of Systems  Constitutional: Negative for fever.  Gastrointestinal: Positive for nausea, vomiting and abdominal pain (epigastric).  Genitourinary: Positive for flank pain.  All other systems reviewed and are negative.     Allergies  Hydrocodone  Home Medications   Prior to Admission medications   Medication Sig Start Date End Date Taking? Authorizing Provider  oxyCODONE-acetaminophen (ROXICET) 5-325 MG per tablet Take 2 tablets by mouth every 4 (four) hours as needed. May take 1-2 tablets every 4-6 hours as needed for pain 03/08/15  Yes Waynard Reeds, MD  docusate  sodium (COLACE) 100 MG capsule Take 1 capsule (100 mg total) by mouth 2 (two) times daily. 03/08/15   Waynard Reeds, MD  ibuprofen (ADVIL,MOTRIN) 600 MG tablet Take 1 tablet (600 mg total) by mouth every 6 (six) hours as needed. 03/08/15   Waynard Reeds, MD  Prenatal Vit-Fe Fumarate-FA (PRENATAL MULTIVITAMIN) TABS Take 1 tablet by mouth at bedtime.    Historical Provider, MD   BP 116/78 mmHg  Pulse 100  Temp(Src) 97.5 F (36.4 C) (Oral)  Resp 22  Ht  (1.702 m)  Wt 100 lb (45.36 kg)  BMI 15.66 kg/m2  SpO2 100%  Breastfeeding? Yes Physical Exam  Constitutional: She is oriented to person, place, and time. She appears well-developed and well-nourished. No distress.  HENT:  Head: Normocephalic.  Eyes: Conjunctivae are normal.  Neck: Neck supple. No tracheal deviation present.  Cardiovascular: Normal rate, regular rhythm and normal heart sounds.   Pulmonary/Chest: Effort normal and breath sounds normal. No respiratory distress.  Abdominal: Soft. She exhibits no distension. There is no tenderness. There is CVA tenderness (on right). There is no rebound and no guarding.  Neurological: She is alert and oriented to person, place, and time.  Skin: Skin is warm and dry.  Psychiatric: [id number] normal mood and affect.    ED Course  Procedures (including critical care time)  Emergency Focused Ultrasound Exam Limited Retroperitoneal Ultrasound of Kidneys  Performed and interpreted by Dr. Clydene PughKnott Focused abdominal ultrasound with both kidneys imaged in transverse and longitudinal planes in real-time. Indication: flank pain Findings: bilateral kidneys present, small shadowing on left, + anechoic areas on left Interpretation: left hydronephrosis visualized.  + intrarenal stones on left   Images archived electronically  CPT Code: [id number]   Labs Review Labs Reviewed  URINALYSIS, ROUTINE W REFLEX MICROSCOPIC ([facility]) - Abnormal; Notable for the following:    pH 8.5 (*)    Protein, ur 100 (*)     Leukocytes, UA SMALL (*)    All other components within normal limits  COMPREHENSIVE METABOLIC PANEL - Abnormal; Notable for the following:    Glucose, Bld 109 (*)    Creatinine, Ser 0.40 (*)    Total Protein 8.2 (*)    AST 450 (*)    ALT 250 (*)    Alkaline Phosphatase 147 (*)    Total Bilirubin 1.5 (*)    All other components within normal limits  CBC WITH DIFFERENTIAL/PLATELET - Abnormal; Notable for the following:    WBC 11.7 (*)    Neutro Abs 10.0 (*)    All other components within normal limits  URINE MICROSCOPIC-ADD ON - Abnormal; Notable for the following:    Squamous Epithelial / LPF 6-30 (*)    Bacteria, UA MANY (*)    All other components within normal limits  PREGNANCY, URINE  HEPATITIS PANEL, ACUTE    Imaging Review Ct Renal Stone Study  11/14/2015  CLINICAL DATA:  Awoke from sleep this morning with right lower back pain, similar to previous experiences with kidney stones. Nausea and vomiting. EXAM: CT ABDOMEN AND PELVIS WITHOUT CONTRAST TECHNIQUE: Multidetector CT imaging of the abdomen and pelvis was performed following the standard protocol without IV contrast. COMPARISON:  10/03/2008; renal ultrasound -07/11/2014 FINDINGS: The lack of intravenous contrast limits the ability to evaluate solid abdominal organs. There is a punctate (approximately 0.6 x 0.3 cm) stone within the superior aspect of the left ureter (axial image 36, series 2, coronal image 33, series 5) with associated upstream moderate ureterectasis and pelvicaliectasis. There is an additional punctate (approximately 2 mm) nonobstructing stone within the inferior pole the left kidney (image 30, series 2). Note is made of a minimal amount of asymmetric left-sided perinephric stranding. No definitive right-sided renal stones are seen though there is mild diffuse increased attenuation of multiple bilateral renal calices, right greater than left (representative coronal images 40, 41 and 42, series 5), suggestive of  medullary nephrocalcinosis. No evidence of right-sided urinary obstruction. No renal stones are seen along the expected course of the right ureter or the urinary bladder. Normal noncontrast appearance of the urinary bladder given degree of distention. Normal hepatic contour. The common bile duct appears mildly dilated on this noncontrast examination measuring approximately 8 mm in greatest oblique coronal diameter (coronal image 30, series 5), unchanged since the 2009 examination and likely the sequela of post cholecystectomy state. No ascites. Normal noncontrast appearance the bilateral adrenal glands, pancreas and spleen. Incidental note is made of a small splenule. The sigmoid and descending colon are underdistended. Moderate colonic stool burden without evidence of enteric obstruction. The bowel is otherwise normal in course and caliber without wall thickening on this noncontrast examination. The appendix is not visualized, although however there is no definitive pericecal inflammatory change on this noncontrast examination. No pneumoperitoneum, pneumatosis or portal venous gas. Normal caliber of the abdominal aorta. No bulky retroperitoneal, mesenteric, pelvic or inguinal lymphadenopathy on this noncontrast  examination. Normal noncontrast appearance of the pelvic organs. The uterus is incidentally noted to be retroverted. Limited visualization of the lower thorax is negative for focal airspace opacity or pleural effusion. Normal heart size.  No pericardial effusion. No acute or aggressive osseous abnormalities. Regional soft tissues appear normal. IMPRESSION: 1. Punctate (approximately 6 mm) stone within the superior aspect of the left ureter results in moderate upstream ureterectasis and pelvicaliectasis. 2. Additional punctate (approximately 2 mm) nonobstructing left-sided renal stone. 3. No evidence of right-sided nephrolithiasis or urinary obstruction. 4. Increased attenuation of multiple renal calices  bilaterally which could be seen in the setting of medullary nephrocalcinosis. Clinical correlation is advised. Electronically Signed   By: Simonne Come M.D.   On: 11/14/2015 09:56   US Abdomen Limited Ruq  11/14/2015  CLINICAL DATA:  Elevated LFTs. History of cholecystectomy and renal stones. EXAM: US ABDOMEN LIMITED - RIGHT UPPER QUADRANT COMPARISON:  Abdominal ultrasound - 11/14/2015 FINDINGS: Gallbladder: Surgically absent. Common bile duct: Diameter: Borderline enlarged measuring 6 mm in diameter, likely the sequela of post cholecystectomy state. Liver: Homogeneous hepatic echotexture. No discrete hepatic lesions. There is mild centralized intrahepatic biliary duct dilatation (image 6 and 9), potentially secondary to reservoir phenomenon due to post cholecystectomy state. No definite evidence of intrahepatic biliary ductal dilatation. No ascites. IMPRESSION: 1. Mild dilatation of the common bile duct with associated centralized intrahepatic bili duct dilatation, the etiology of which is not depicted on this examination though presumably the sequela of reservoir phenomenon and postcholecystectomy state. Further evaluation with nonemergent MRCP could be performed as indicated. 2. Post cholecystectomy. Electronically Signed   By: Simonne Come M.D.   On: 11/14/2015 11:52   I have personally reviewed and evaluated these images and lab results as part of my medical decision-making.   EKG Interpretation None      MDM   Final diagnoses:  Acute right flank pain  Constipation by delayed colonic transit    29 year old female presents with right flank pain starting overnight that she states is consistent with prior kidney stones. The pain also radiates into the front part of her abdomen. She has a history of remote cholecystectomy. No significant distress or VS abnormality on arrival. Symptoms are right sided but bedside ultrasound concerning for pathologic finding on left, CT ordered to r/o bilateral  obstruction. 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 No right-sided nephrolithiasis or obstruction is noted and I do not feel that the patient's pain is secondary to any acute renal pathology. She does have a moderate colonic stool burden which appears concentrated in the area of discomfort. She has incidentally noted transaminitis which is unexplained. She does admit to having multiple alcoholic beverages last night and with AST much greater than ALT it is possible that this is secondary to an acute injury from alcohol but patient denies long-standing heavy use. We will initiate workup with hepatitis panel and ultrasound. Imaging shows normal postcholecystectomy findings without an explanation for her labs. Recommended expedited follow-up with her primary care physician to establish care and follow-up, repeat labs or refer to gastroenterology for further elucidation. In the meantime will trial on a bowel regimen to help relieve constipation. No acute vital sign abnormalities, patient able to tolerate food and water by mouth, no significant Medical comorbidity is noted and at this time I feel the patient is safe to return home. Return precautions were discussed for potential initial missed diagnosis and the possibility of this was explained given her laboratory  findings of unclear etiology or significance.  Patient is not to intake any alcohol or Tylenol until cleared by a physician to do so.     Lyndal Pulley, MD 11/14/15 [phone]

## 2015-11-15 LAB — HEPATITIS PANEL, ACUTE
HCV Ab: <0.1 {s_co_ratio} (ref 0.0–0.9)
Hep A IgM: NEGATIVE
Hep B C IgM: NEGATIVE
Hepatitis B Surface Ag: NEGATIVE

## 2015-12-02 HISTORY — PX: LITHOTRIPSY: SUR834

## 2015-12-14 ENCOUNTER — Encounter (HOSPITAL_COMMUNITY): Payer: Self-pay | Admitting: *Deleted

## 2015-12-14 ENCOUNTER — Other Ambulatory Visit: Payer: Self-pay | Admitting: Urology

## 2015-12-14 NOTE — Progress Notes (Signed)
Spoke to patient via phone regarding litho procedure on Monday am in order to review info. She stated that the MD in the ER in December told her not to take any Tylenol products due to high liver enzymes until she had a follow up with her medical MD. She has an appt with medical MD next week regarding this bloodwork.

## 2015-12-16 NOTE — H&P (Signed)
Chief Complaint Left ureteral stone   History of Present Illness    This is a 30 year old female who presents with a 6 mm proximal left ureteral stone that was diagnosed emergency department earlier this week.  She was noted left flank pain, nausea, and vomiting. This has resolved since the emergency department. She has not had fevers or chills. She has formed multiple stones in the past. Some have required intervention via cystocopy and ureteroscopy. She has had a 24-hour urine study before. This was normal per the patient. She makes calcium oxalate stones. She does not pass a stone during knowledge. She feels that it is still there, but is not bothering her as much.     Of note, the patient's liver enzymes were elevated in the emergency part.     January 2017 interval history:  The patient is still having left flank pain. She notes it is moved more anteriorly towards her groin. She's had no tumors or chills. He does not pass the stone. KUB today shows a stone in the left distal ureter. She denies any changes to her urinary gastrointestinal. The stone is continuing to cause her to miss work when she has a bout of colic. She would like definitive management of the stone at this time.     Past Medical History Problems  1. 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 History of Cholecystectomy 2. History of Cystoscopy With Insertion Of Ureteral Stent Left 3. History of Cystoscopy With Ureteroscopy With Lithotripsy 4. History of Oral Surgery  Current Meds 1. OxyCODONE HCl - 5 MG Oral Capsule; TAKE 1 CAPSULE EVERY 3 TO 4 HOURS AS  NEEDED FOR PAIN;  Therapy: 16Dec2016 to (Evaluate:26Dec2016); Last Rx:16Dec2016 Ordered 2. Tamsulosin HCl - 0.4 MG Oral Capsule (Flomax); Take one tablet PO qhs;  Therapy: 16Dec2016 to (Evaluate:15Jan2017); Last Rx:16Dec2016 Ordered  Allergies Medication  1. Hydrocodone-Acetaminophen CAPS  Family History Problems  1. Family  history of Alzheimer Disease : Paternal Grandmother 2. Family history of Family Health Status - Father's Age : Maternal Grandmother   53 3. Family history of Family Health Status - Mother's Age : Maternal Grandmother   49 4. Family history of Heart Disease : Paternal Grandfather 5. Family history of Liver Cancer  Social History Problems  1. Alcohol Use (History) 2. Caffeine Use   once a day 3. Denied: History of Marital History - Single 4. Married 5. Never A Smoker 6. Denied: History of Tobacco Use  Vitals Vital Signs [Data Includes: Last 1 Day]  Recorded: 13Jan2017 04:06PM  Blood Pressure: 113 / 77 Temperature: 98.2 F Heart Rate: 74  Physical Exam Constitutional: Well nourished . No acute distress.   ENT:. The ears and nose are normal in appearance.   Neck: The appearance of the neck is normal.   Pulmonary: No respiratory distress.   Cardiovascular:. No peripheral edema.   Abdomen: The abdomen is soft and nontender. Left CVA tenderness.   Skin: Normal skin turgor and no visible rash.   Neuro/Psych:. Mood and affect are appropriate. No focal sensory deficits.    Results/Data Urine [Data Includes: Last 1 Day]   13Jan2017  COLOR YELLOW   APPEARANCE CLEAR   SPECIFIC GRAVITY 1.010   pH 6.5   GLUCOSE NEGATIVE   BILIRUBIN NEGATIVE   KETONE NEGATIVE   BLOOD 3+   PROTEIN NEGATIVE   NITRITE NEGATIVE   LEUKOCYTE ESTERASE NEGATIVE   SQUAMOUS EPITHELIAL/HPF 0-5 HPF  WBC 0-5 WBC/HPF  RBC 40-60  RBC/HPF  BACTERIA NONE SEEN HPF  CRYSTALS See Below HPF  CASTS NONE SEEN LPF  Yeast NONE SEEN HPF   KUB:  The previously seen 6 mm stone has now migrated to the distal left ureter. No other acute findings.    Renal ultrasound:  Right kidney: Length 11.4 cm. Depth 4.2 cm. Cortical with 0.81 cm. Width 5.07. No hydronephrosis.  Left kidney: Length 10.9 cm. Depth of 4.5 cm. Cortical width 1.3 cm. Width 4.5 cm. No hydronephrosis.  Bladder: Not seen. PVR 0 cc.    Assessment Assessed  1. Kidney stone on left side (N20.0)  The patient would like to have definitive stone management as the intermittent episodes of colic are interfering with her quality of life. I discussed treatment options which include lithotripsy and ureteroscopy. She is interested in lithotripsy. I discussed the risks, benefits, and indications of the procedure with her. She is agreeable to proceeding. She understands this may take more than one procedure, though I think given her stone location she is likely to pass all residual fragments with just one procedure.   Plan Health Maintenance  1. UA With REFLEX; [Do Not Release]; Status:Complete;   Done: 13Jan2017 03:19PM Kidney stone on left side  2. Start: Tamsulosin HCl - 0.4 MG Oral Capsule; TAKE 1 CAPSULE Bedtime 3. URINE CULTURE; Status:In Progress - Specimen/Data Collected;   Done: 13Jan2017      1. Left 6 mm distal ureteral stone  - left ureteral ESWL    2. Nonobstructing left 2 mm stone   -No further workup at this time. We'll monitor size in the future.     3. Asymptomatic microscopic hematuria  We will ensure this resolves once her stone is cleared   After a thorough review of the management options for the patient's condition the patient  elected to proceed with surgical therapy as noted above. We have discussed the potential benefits and risks of the procedure, side effects of the proposed treatment, the likelihood of the patient achieving the goals of the procedure, and any potential problems that might occur during the procedure or recuperation. Informed consent has been obtained.

## 2015-12-17 ENCOUNTER — Encounter (HOSPITAL_COMMUNITY)
Admission: RE | Disposition: A | Discharge status: Home / Self Care | Payer: Self-pay | Source: Ambulatory Visit | Attending: Urology

## 2015-12-17 ENCOUNTER — Encounter (HOSPITAL_COMMUNITY): Payer: Self-pay | Admitting: *Deleted

## 2015-12-17 ENCOUNTER — Ambulatory Visit (HOSPITAL_COMMUNITY): Payer: Managed Care, Other (non HMO)

## 2015-12-17 ENCOUNTER — Ambulatory Visit (HOSPITAL_COMMUNITY)
Admission: RE | Admit: 2015-12-17 | Discharge: 2015-12-17 | Disposition: A | Discharge status: Home / Self Care | Payer: Managed Care, Other (non HMO) | Source: Ambulatory Visit | Attending: Urology | Admitting: Urology

## 2015-12-17 DIAGNOSIS — Z9049 Acquired absence of other specified parts of digestive tract: Secondary | ICD-10-CM | POA: Diagnosis not present

## 2015-12-17 DIAGNOSIS — N201 Calculus of ureter: Secondary | ICD-10-CM | POA: Diagnosis not present

## 2015-12-17 DIAGNOSIS — Z79899 Other long term (current) drug therapy: Secondary | ICD-10-CM | POA: Diagnosis not present

## 2015-12-17 HISTORY — DX: Other specified postprocedural states: Z98.890

## 2015-12-17 HISTORY — DX: Other specified postprocedural states: R11.2

## 2015-12-17 LAB — PREGNANCY, URINE: PREG TEST UR: NEGATIVE

## 2015-12-17 SURGERY — LITHOTRIPSY, ESWL
Anesthesia: LOCAL | Laterality: Left

## 2015-12-17 MED ORDER — DIPHENHYDRAMINE HCL 25 MG PO CAPS
25.0000 mg | ORAL_CAPSULE | ORAL | Status: AC
  Administered 2015-12-17: 25 mg via ORAL
  Filled 2015-12-17: qty 1

## 2015-12-17 MED ORDER — CIPROFLOXACIN HCL 500 MG PO TABS
500.0000 mg | ORAL_TABLET | ORAL | Status: AC
  Administered 2015-12-17: 500 mg via ORAL
  Filled 2015-12-17: qty 1

## 2015-12-17 MED ORDER — SODIUM CHLORIDE 0.9 % IV SOLN
INTRAVENOUS | Status: DC
  Administered 2015-12-17: 08:00:00 via INTRAVENOUS

## 2015-12-17 MED ORDER — DIAZEPAM 5 MG PO TABS
10.0000 mg | ORAL_TABLET | ORAL | Status: AC
  Administered 2015-12-17: 10 mg via ORAL
  Filled 2015-12-17: qty 2

## 2015-12-17 MED ORDER — CEFAZOLIN SODIUM-DEXTROSE 2-3 GM-% IV SOLR
2.0000 g | Freq: Once | INTRAVENOUS | Status: AC
  Administered 2015-12-17: 2 g via INTRAVENOUS
  Filled 2015-12-17: qty 50

## 2015-12-17 NOTE — Discharge Instructions (Signed)
Lithotripsy, Care After °Refer to this sheet in the next few weeks. These instructions provide you with information on caring for yourself after your procedure. Your health care provider may also give you more specific instructions. Your treatment has been planned according to current medical practices, but problems sometimes occur. Call your health care provider if you have any problems or questions after your procedure. °WHAT TO EXPECT AFTER THE PROCEDURE  °· Your urine may have a red tinge for a few days after treatment. Blood loss is usually minimal. °· You may have soreness in the back or flank area. This usually goes away after a few days. The procedure can cause blotches or bruises on the back where the pressure wave enters the skin. These marks usually cause only minimal discomfort and should disappear in a short time. °· Stone fragments should begin to pass within 24 hours of treatment. However, a delayed passage is not unusual. °· You may have pain, discomfort, and feel sick to your stomach (nauseated) when the crushed fragments of stone are passed down the tube from the kidney to the bladder. Stone fragments can pass soon after the procedure and may last for up to 4-8 weeks. °· A small number of patients may have severe pain when stone fragments are not able to pass, which leads to an obstruction. °· If your stone is greater than 1 inch (2.5 cm) in diameter or if you have multiple stones that have a combined diameter greater than 1 inch (2.5 cm), you may require more than one treatment. °· If you had a stent placed prior to your procedure, you may experience some discomfort, especially during urination. You may experience the pain or discomfort in your flank or back, or you may experience a sharp pain or discomfort at the base of your penis or in your lower abdomen. The discomfort usually lasts only a few minutes after urinating. °HOME CARE INSTRUCTIONS  °· Rest at home until you feel your energy  improving. °· Only take over-the-counter or prescription medicines for pain, discomfort, or fever as directed by your health care provider. Depending on the type of lithotripsy, you may need to take antibiotics and anti-inflammatory medicines for a few days. °· Drink enough water and fluids to keep your urine clear or pale yellow. This helps "flush" your kidneys. It helps pass any remaining pieces of stone and prevents stones from coming back. °· Most people can resume daily activities within 1-2 days after standard lithotripsy. It can take longer to recover from laser and percutaneous lithotripsy. °· Strain all urine through the provided strainer. Keep all particulate matter and stones for your health care provider to see. The stone may be as small as a grain of salt. It is very important to use the strainer each and every time you pass your urine. Any stones that are found can be sent to a medical lab for examination. °· Visit your health care provider for a follow-up appointment in a few weeks. Your doctor may remove your stent if you have one. Your health care provider will also check to see whether stone particles still remain. °SEEK MEDICAL CARE IF:  °· Your pain is not relieved by medicine. °· You have a lasting nauseous feeling. °· You feel there is too much blood in the urine. °· You develop persistent problems with frequent or painful urination that does not at least partially improve after 2 days following the procedure. °· You have a congested cough. °· You feel   lightheaded. °· You develop a rash or any other signs that might suggest an allergic problem. °· You develop any reaction or side effects to your medicine(s). °SEEK IMMEDIATE MEDICAL CARE IF:  °· You experience severe back or flank pain or both. °· You see nothing but blood when you urinate. °· You cannot pass any urine at all. °· You have a fever or shaking chills. °· You develop shortness of breath, difficulty breathing, or chest pain. °· You  develop vomiting that will not stop after 6-8 hours. °· You have a fainting episode. °  °This information is not intended to replace advice given to you by your health care provider. Make sure you discuss any questions you have with your health care provider. °  °Document Released: 12/07/2007 Document Revised: 08/08/2015 Document Reviewed: 06/02/2013 °Elsevier Interactive Patient Education ©2016 [employer] ° °

## 2015-12-17 NOTE — Progress Notes (Signed)
MD called and stated to give patient loading dose of IV Ancef in addition to po Cipro patient already took. Called Pharmacist Christine to ask for correct loading dose mg for patient's weight. Christine Pharmacist stated 2gm IV Ancef.

## 2015-12-17 NOTE — Interval H&P Note (Signed)
History and Physical Interval Note:  12/17/2015 7:11 AM  Ulyses SouthwardErynn Maryla MorrowM Mcquire  has presented today for surgery, with the diagnosis of LEFT URETERAL STONE  The various methods of treatment have been discussed with the patient and family. After consideration of risks, benefits and other options for treatment, the patient has consented to  Procedure(s) with comments: EXTRACORPOREAL SHOCK WAVE LITHOTRIPSY (ESWL) (Left) - 75 MINS  [phone] CELL  [employer] [id number] ID [id number] as a surgical intervention .  The patient's history has been reviewed, patient examined, no change in status, stable for surgery.  I have reviewed the patient's chart and labs.  Questions were answered to the patient's satisfaction.     Vishnu Moeller A

## 2015-12-31 ENCOUNTER — Encounter: Payer: Self-pay | Admitting: *Deleted

## 2015-12-31 ENCOUNTER — Telehealth: Payer: Self-pay | Admitting: *Deleted

## 2015-12-31 NOTE — Telephone Encounter (Signed)
Pre-Visit Call completed with patient and chart updated.   Pre-Visit Info documented in Specialty Comments under [employer].    

## 2016-01-01 ENCOUNTER — Encounter: Payer: Self-pay | Admitting: Family

## 2016-01-01 ENCOUNTER — Ambulatory Visit (INDEPENDENT_AMBULATORY_CARE_PROVIDER_SITE_OTHER): Payer: Managed Care, Other (non HMO) | Admitting: Family

## 2016-01-01 VITALS — BP 110/84 | HR 65 | Temp 98.6°F | Resp 16 | Ht 64.0 in | Wt 101.8 lb

## 2016-01-01 DIAGNOSIS — Z8669 Personal history of other diseases of the nervous system and sense organs: Secondary | ICD-10-CM | POA: Insufficient documentation

## 2016-01-01 DIAGNOSIS — J45990 Exercise induced bronchospasm: Secondary | ICD-10-CM | POA: Diagnosis not present

## 2016-01-01 DIAGNOSIS — N200 Calculus of kidney: Secondary | ICD-10-CM | POA: Diagnosis not present

## 2016-01-01 DIAGNOSIS — R7989 Other specified abnormal findings of blood chemistry: Secondary | ICD-10-CM

## 2016-01-01 DIAGNOSIS — R945 Abnormal results of liver function studies: Secondary | ICD-10-CM

## 2016-01-01 LAB — HEPATIC FUNCTION PANEL
ALK PHOS: 87 U/L (ref 39–117)
ALT: 15 U/L (ref 0–35)
AST: 12 U/L (ref 0–37)
Albumin: 4.7 g/dL (ref 3.5–5.2)
BILIRUBIN DIRECT: 0.1 mg/dL (ref 0.0–0.3)
BILIRUBIN TOTAL: 0.4 mg/dL (ref 0.2–1.2)
Total Protein: 7.5 g/dL (ref 6.0–8.3)

## 2016-01-01 LAB — RHEUMATOID FACTOR: Rhuematoid fact SerPl-aCnc: <10 IU/mL (ref <=14)

## 2016-01-01 LAB — SEDIMENTATION RATE: Sed Rate: 0 mm/hr (ref 0–22)

## 2016-01-01 NOTE — Progress Notes (Signed)
Pre visit review using our clinic review tool, if applicable. No additional management support is needed unless otherwise documented below in the visit note. 

## 2016-01-01 NOTE — Assessment & Plan Note (Signed)
Stable, monitor.  °

## 2016-01-01 NOTE — Progress Notes (Signed)
Subjective:    Patient ID: Carrie Combs, female    DOB: [birth year], 30 y.o.   MRN: [id number]  HPI  Nephrolithiasis reports that she was first diagnosed in 2007.  She had L sided stone.  Had lithotripsy in January.   Elevated LFT- had AST 450 and ALT 250 last month in the ED. Pt denies hx of abdominal pain.  S/p chole 2003.  Acute hep panel was negative.    Migraines- reports that since she went gluten free her migraines have improved.  Asthma- exercise induced.  Does not need ihaler.     Review of Systems  Constitutional: Negative for unexpected weight change.  HENT:       Mild chest congestion  Respiratory: Negative for cough.   Cardiovascular: Negative for leg swelling.  Gastrointestinal: Negative for nausea, vomiting and abdominal pain.  Genitourinary: Negative for dysuria, frequency and menstrual problem.  Musculoskeletal: Negative for myalgias and arthralgias.  Skin: Negative for rash.  Neurological: Positive for headaches.  Hematological: Negative for adenopathy.  Psychiatric/Behavioral:       Denies depression/anxiety   Past Medical History  Diagnosis Date  . Migraine   . History of kidney stones   . Asthma     exercise-induced  . PONV (postoperative nausea and vomiting)     Social History   Social History  . Marital Status: Married    Spouse Name: N/A  . Number of Children: N/A  . Years of Education: N/A   Occupational History  . Not on file.   Social History Main Topics  . Smoking status: Never Smoker   . Smokeless tobacco: Never Used  . Alcohol Use: 0.0 oz/week    0 Standard drinks or equivalent per week     Comment: occasional  . Drug Use: No  . Sexual Activity: Yes   Other Topics Concern  . 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    Completed college    Past Surgical History  Procedure  Laterality Date  . Cholecystectomy  2003  . Kidney stone surgery  2009  . Wisdom tooth extraction  10/2002  . Lithotripsy  12/2015    Family History  Problem Relation Age of Onset  . Other Neg Hx   . Cancer Paternal Grandfather     liver  . Alcohol abuse Paternal Grandfather   . Hyperlipidemia Mother   . Diabetes Father   . Hypertension Father   . Alcohol abuse Father   . Alzheimer's disease Maternal Grandmother   . Arthritis Maternal Grandmother   . Parkinson's disease Maternal Grandfather   . Stroke Paternal Grandmother   . Heart disease Paternal Grandmother     Allergies  Allergen Reactions  . Hydrocodone Nausea And Vomiting    Current Outpatient Prescriptions on File Prior to Visit  Medication Sig Dispense Refill  . tamsulosin (FLOMAX) 0.4 MG CAPS capsule Take 0.4 mg by mouth.     No current facility-administered medications on file prior to visit.    BP 110/84 mmHg  Pulse 65  Temp(Src) 98.6 F (37 C) (Oral)  Resp 16  Ht  (1.626 m)  Wt 101 lb 12.8 oz (46.176 kg)  BMI 17.47 kg/m2  SpO2 100%  LMP 12/09/2015       Objective:   Physical Exam  Constitutional: She is oriented  to person, place, and time. She appears well-developed and well-nourished.  HENT:  Right Ear: Tympanic membrane and ear canal normal.  Left Ear: Tympanic membrane and ear canal normal.  Mouth/Throat: No oropharyngeal exudate, posterior oropharyngeal edema or posterior oropharyngeal erythema.  Eyes: No scleral icterus.  Cardiovascular: Normal rate, regular rhythm and normal heart sounds.   No murmur heard. Pulmonary/Chest: Effort normal and breath sounds normal. No respiratory distress. She has no wheezes.  Abdominal: Soft. Bowel sounds are normal. She exhibits no distension. There is no tenderness. There is no rebound and no guarding.  Musculoskeletal: She exhibits no edema.  Lymphadenopathy:    She has no cervical adenopathy.  Neurological: She is alert and oriented to person,  place, and time.  Psychiatric: She has a normal mood and affect. Her behavior is normal. Judgment and thought content normal.          Assessment & Plan:  Abnormal LFT-  AST 450 and ALD 250 last month. Were also elevated last year.  Repeated today and LFT's have normalized. I advised pt that at this point, I do not think we need to refer her to GI (see mychart).

## 2016-01-01 NOTE — Assessment & Plan Note (Signed)
Rare sxs, declines albuterol mdi.

## 2016-01-01 NOTE — Assessment & Plan Note (Signed)
Followed by Urology, status post lithotripsy, clinically stable at present.

## 2016-01-01 NOTE — Patient Instructions (Signed)
Please complete lab work prior to leaving. Continue to avoid alcohol and tylenol products. You will be contacted about your referral to GI. Schedule a complete physical at your convenience. Welcome to [facility]!

## 2016-01-02 LAB — ANA: Anti Nuclear Antibody(ANA): NEGATIVE

## 2016-01-17 ENCOUNTER — Ambulatory Visit: Payer: Managed Care, Other (non HMO) | Admitting: Gastroenterology

## 2016-03-06 ENCOUNTER — Telehealth: Payer: Self-pay | Admitting: Behavioral Health

## 2016-03-06 ENCOUNTER — Encounter: Payer: Self-pay | Admitting: Behavioral Health

## 2016-03-06 NOTE — Telephone Encounter (Signed)
Pre-Visit Call completed with patient and chart updated.   Pre-Visit Info documented in Specialty Comments under [employer].    

## 2016-03-07 ENCOUNTER — Ambulatory Visit (INDEPENDENT_AMBULATORY_CARE_PROVIDER_SITE_OTHER): Payer: Managed Care, Other (non HMO) | Admitting: Family

## 2016-03-07 ENCOUNTER — Encounter: Payer: Self-pay | Admitting: Family

## 2016-03-07 VITALS — BP 98/68 | HR 97 | Temp 98.0°F | Resp 16 | Ht 64.0 in | Wt 103.0 lb

## 2016-03-07 DIAGNOSIS — Z0000 Encounter for general adult medical examination without abnormal findings: Secondary | ICD-10-CM

## 2016-03-07 LAB — URINALYSIS, ROUTINE W REFLEX MICROSCOPIC
BILIRUBIN URINE: NEGATIVE
Hgb urine dipstick: NEGATIVE
KETONES UR: NEGATIVE
LEUKOCYTES UA: NEGATIVE
NITRITE: NEGATIVE
RBC / HPF: NONE SEEN (ref 0–?)
Specific Gravity, Urine: <=1.005 — AB (ref 1.000–1.030)
Total Protein, Urine: NEGATIVE
Urine Glucose: NEGATIVE
Urobilinogen, UA: 0.2 (ref 0.0–1.0)
WBC UA: NONE SEEN (ref 0–?)
pH: 5.5 (ref 5.0–8.0)

## 2016-03-07 LAB — CBC WITH DIFFERENTIAL/PLATELET
BASOS ABS: 0.1 10*3/uL (ref 0.0–0.1)
BASOS PCT: 0.6 % (ref 0.0–3.0)
EOS PCT: 2.2 % (ref 0.0–5.0)
Eosinophils Absolute: 0.2 10*3/uL (ref 0.0–0.7)
HEMATOCRIT: 39.6 % (ref 36.0–46.0)
Hemoglobin: 13.4 g/dL (ref 12.0–15.0)
LYMPHS PCT: 32.3 % (ref 12.0–46.0)
Lymphs Abs: 3 10*3/uL (ref 0.7–4.0)
MCHC: 33.9 g/dL (ref 30.0–36.0)
MCV: 86.2 fl (ref 78.0–100.0)
MONOS PCT: 6 % (ref 3.0–12.0)
Monocytes Absolute: 0.6 10*3/uL (ref 0.1–1.0)
NEUTROS ABS: 5.5 10*3/uL (ref 1.4–7.7)
Neutrophils Relative %: 58.9 % (ref 43.0–77.0)
PLATELETS: 256 10*3/uL (ref 150.0–400.0)
RBC: 4.59 Mil/uL (ref 3.87–5.11)
RDW: 13.6 % (ref 11.5–15.5)
WBC: 9.4 10*3/uL (ref 4.0–10.5)

## 2016-03-07 LAB — LIPID PANEL
CHOLESTEROL: 172 mg/dL (ref 0–200)
HDL: 57.4 mg/dL (ref >39.00)
LDL CALC: 100 mg/dL — AB (ref 0–99)
NonHDL: 114.11
Total CHOL/HDL Ratio: 3
Triglycerides: 69 mg/dL (ref 0.0–149.0)
VLDL: 13.8 mg/dL (ref 0.0–40.0)

## 2016-03-07 LAB — BASIC METABOLIC PANEL
BUN: 10 mg/dL (ref 6–23)
CALCIUM: 9.3 mg/dL (ref 8.4–10.5)
CHLORIDE: 103 meq/L (ref 96–112)
CO2: 26 meq/L (ref 19–32)
Creatinine, Ser: 0.53 mg/dL (ref 0.40–1.20)
GFR: 142.27 mL/min (ref >60.00)
Glucose, Bld: 83 mg/dL (ref 70–99)
POTASSIUM: 3.9 meq/L (ref 3.5–5.1)
SODIUM: 136 meq/L (ref 135–145)

## 2016-03-07 LAB — TSH: TSH: 1.48 u[IU]/mL (ref 0.35–4.50)

## 2016-03-07 NOTE — Progress Notes (Signed)
Subjective:    Patient ID: Carrie Combs, female    DOB: 09/09/1986, 30 y.o.   MRN: [id number]  HPI  Ms. Carrie Combs is a 30 yr old female who presents today for cpx.  Immunizations: up to date. Diet: healthy Exercise: Does videos at home Pap Smear:  07/02/15 Dental:  Up to date Vision: due  Review of Systems  Constitutional: Negative for unexpected weight change.  HENT: Negative for hearing loss and rhinorrhea.   Eyes: Negative for visual disturbance.  Respiratory: Negative for cough.   Gastrointestinal: Negative for diarrhea and constipation.  Genitourinary: Negative for dysuria, frequency and menstrual problem.  Musculoskeletal: Negative for myalgias and arthralgias.  Skin: Negative for rash.  Neurological:       Reports HA's prior to menses  Hematological: Negative for adenopathy.  Psychiatric/Behavioral:       Denies depression/anxiety       Past Medical History  Diagnosis Date  . Migraine   . History of kidney stones   . Asthma     exercise-induced  . PONV (postoperative nausea and vomiting)     Social History   Social History  . Marital Status: Married    Spouse Name: N/A  . Number of Children: N/A  . Years of Education: N/A   Occupational History  . Not on file.   Social History Main Topics  . Smoking status: Never Smoker   . Smokeless tobacco: Never Used  . Alcohol Use: 0.0 oz/week    0 Standard drinks or equivalent per week     Comment: occasional  . Drug Use: No  . Sexual Activity: Yes   Other Topics Concern  . Not on file   Social History Narrative   2014- daughter   2016- daugher   Married   Works as a Chief Strategy Officercosmetologist   Completed college   Enjoys spending time with friends, reading.    Completed college    Past Surgical History  Procedure Laterality Date  . Cholecystectomy  2003  . Kidney stone surgery  2009  . Wisdom tooth extraction  10/2002  . Lithotripsy  12/2015    Family History  Problem Relation Age of Onset  . Other Neg  Hx   . Cancer Paternal Grandfather     liver  . Alcohol abuse Paternal Grandfather   . Hyperlipidemia Mother   . Diabetes Father   . Hypertension Father   . Alcohol abuse Father   . Alzheimer's disease Maternal Grandmother   . Arthritis Maternal Grandmother   . Parkinson's disease Maternal Grandfather   . Stroke Paternal Grandmother   . Heart disease Paternal Grandmother     Allergies  Allergen Reactions  . Hydrocodone Nausea And Vomiting    No current outpatient prescriptions on file prior to visit.   No current facility-administered medications on file prior to visit.    BP 98/68 mmHg  Pulse 97  Temp(Src) 98 F (36.7 C) (Oral)  Resp 16  Ht 5\' 4"  (1.626 m)  Wt 103 lb (46.72 kg)  BMI 17.67 kg/m2  SpO2 98%  LMP 02/10/2016    Objective:   Physical Exam  Constitutional: She is oriented to person, place, and time. She appears well-developed and well-nourished.  HENT:  Head: Normocephalic and atraumatic.  Right Ear: Tympanic membrane and ear canal normal.  Left Ear: Tympanic membrane and ear canal normal.  Mouth/Throat: No oropharyngeal exudate, posterior oropharyngeal edema or posterior oropharyngeal erythema.  Eyes: Pupils are equal, round, and reactive to light. Right  eye exhibits no discharge. No scleral icterus.  Cardiovascular: Normal rate, regular rhythm and normal heart sounds.   No murmur heard. Pulmonary/Chest: Effort normal and breath sounds normal. No respiratory distress. She has no wheezes.  Musculoskeletal: She exhibits no edema.  Lymphadenopathy:    She has no cervical adenopathy.  Neurological: She is alert and oriented to person, place, and time.  Skin: Skin is warm and dry.  Psychiatric: She has a normal mood and affect. Her behavior is normal. Judgment and thought content normal.          Assessment & Plan:

## 2016-03-07 NOTE — Progress Notes (Signed)
Pre visit review using our clinic review tool, if applicable. No additional management support is needed unless otherwise documented below in the visit note/SLS  

## 2016-03-07 NOTE — Patient Instructions (Signed)
Please complete lab work prior to leaving. Continue healthy diet/exercise.

## 2016-03-09 DIAGNOSIS — Z0000 Encounter for general adult medical examination without abnormal findings: Secondary | ICD-10-CM | POA: Insufficient documentation

## 2016-03-09 NOTE — Assessment & Plan Note (Signed)
Continue healthy diet, exercise.  Obtain routine lab work.  Immunizations reviewed and up to date.

## 2016-03-18 ENCOUNTER — Encounter: Payer: Self-pay | Admitting: Internal Medicine

## 2016-03-18 ENCOUNTER — Ambulatory Visit (INDEPENDENT_AMBULATORY_CARE_PROVIDER_SITE_OTHER): Payer: Managed Care, Other (non HMO) | Admitting: Internal Medicine

## 2016-03-18 ENCOUNTER — Other Ambulatory Visit (INDEPENDENT_AMBULATORY_CARE_PROVIDER_SITE_OTHER): Payer: Managed Care, Other (non HMO)

## 2016-03-18 VITALS — BP 96/60 | HR 76 | Ht 64.0 in | Wt 103.0 lb

## 2016-03-18 DIAGNOSIS — R1084 Generalized abdominal pain: Secondary | ICD-10-CM | POA: Diagnosis not present

## 2016-03-18 DIAGNOSIS — R945 Abnormal results of liver function studies: Principal | ICD-10-CM

## 2016-03-18 DIAGNOSIS — R7989 Other specified abnormal findings of blood chemistry: Secondary | ICD-10-CM

## 2016-03-18 DIAGNOSIS — R1013 Epigastric pain: Secondary | ICD-10-CM

## 2016-03-18 DIAGNOSIS — R197 Diarrhea, unspecified: Secondary | ICD-10-CM

## 2016-03-18 LAB — IGA: IgA: 134 mg/dL (ref 68–378)

## 2016-03-18 NOTE — Progress Notes (Signed)
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 The patient is also questioning possible celiac disease. She is status post cholecystectomy in [place] in 2003. Was told that she had "dark gravel". On 2 occasions in the past several years she has had acute abdominal pain associated with markedly elevated liver test. Most recently 11/14/2015. At that time she describes severe epigastric and right-sided pain. Workup for kidney stones was negative. Transaminases were the 250-450 range with alk phosphatase 147 and total bilirubin 1.5. Abdominal ultrasound at that time revealed a 6 mm duct. Evidence of cholecystectomy noted. She does state that her pain was reminiscent of prior gallbladder pain. With time her pain resolved. Similar problems in February 2015 with transaminases in the 100-200 range. Liver tests 01/01/2016 were entirely normal. Asymptomatic at that time. She has had different test to evaluate elevated liver tests including ANA, rheumatoid factor, ESR, hepatitis serologies. These have been unremarkable. Currently feels well. Patient does tell me that postcholecystectomy she would develop problems with postprandial abdominal cramping, diaphoresis, urgency, and diarrhea on a daily basis. About 3 and half years ago she went gluten-free. Since that time, she rarely has abdominal complaints as previous. No family history of celiac disease.  REVIEW OF SYSTEMS:  All non-GI ROS negative except for headaches  Past Medical History  Diagnosis Date  . Migraine   . History of kidney stones   . Asthma     exercise-induced  . PONV (postoperative nausea and vomiting)   . Abnormal LFTs     Past Surgical History  Procedure Laterality Date  . Cholecystectomy  2003  . Kidney stone surgery  2009  . Wisdom tooth  extraction  10/2002  . Lithotripsy  12/2015    Social History Carrie Combs  reports that she has never smoked. She has never used smokeless tobacco. She reports that she drinks alcohol. She reports that she does not use illicit drugs.  family history includes Alcohol abuse in her father and paternal grandfather; Alzheimer's disease in her maternal grandmother; Arthritis in her maternal grandmother; Cancer in her paternal grandfather; Diabetes in her father; Heart disease in her paternal grandmother; Hyperlipidemia in her mother; Hypertension in her father; Parkinson's disease in her maternal grandfather; Stroke in her paternal grandmother. There is no history of Other.  Allergies  Allergen Reactions  . Hydrocodone Nausea And Vomiting       PHYSICAL EXAMINATION: Vital signs: BP 96/60 mmHg  Pulse 76  Ht '5\' 4"'  (1.626 m)  Wt 103 lb (46.72 kg)  BMI 17.67 kg/m2  LMP 03/13/2016  Constitutional: generally well-appearing, no acute distress Psychiatric: alert and oriented x3, cooperative Eyes: extraocular movements intact, anicteric, conjunctiva pink Mouth: oral pharynx moist, no lesions Neck: supple no lymphadenopathy Cardiovascular: heart regular rate and rhythm, no murmur Lungs: clear to auscultation bilaterally Abdomen: soft, nontender, nondistended, no obvious ascites, no peritoneal signs, normal bowel sounds, no organomegaly Rectal:Omitted Extremities: no clubbing cyanosis or lower extremity edema bilaterally Skin: no lesions on visible extremities Neuro: No focal deficits. Normal DTRs. Cranial nerves intact  ASSESSMENT:  #1. Elevated liver tests associated with episodes of abdominal pain. Suspect choledocholithiasis or sphincter of Oddi dysfunction. #2. Irritable bowel type symptoms improved with self-imposed gluten avoidance. Question celiac disease versus non-celiac gluten responder #3. She did have microcytic anemia in the past. Question celiac  versus menstrual blood loss #4.  Documented history of recurrent symptomatic nephrolithiasis requiring urologic intervention   PLAN:  #1. I discussed in detail options for further evaluation of her liver test elevations associated with abdominal pain. Specifically we discussed MRCP (with its limitations for stones less than 5 mm) versus more sensitive, but invasive, endoscopic ultrasound. We also discussed same sitting conversion to ERCP with sphincterotomy should stones be found at EUS. She was in favor of proceeding with endoscopic ultrasound with potential conversion to ERCP. I agree. I will refer her to Dr. Owens Loffler regarding this evaluation. For negative EUS, we also discussed referral to tertiary care center for evaluation of possible sphincter of Oddi dysfunction should she develop recurrent pain, in the future, with documented recurrent elevated liver tests. #2. I discussed with her that she may have celiac disease or be a non-celiac gluten responder. We also discussed that testing (serologies, EGD with biopsy) would likely be normal if she was truly avoiding all gluten. We will proceed with serologies, which if elevated, may suggest the need to confirm with biopsy and more carefully and completely restrict gluten.  A copy of this consultation note has been sent to [name] NP

## 2016-03-18 NOTE — Patient Instructions (Signed)
Your physician has requested that you go to the basement for the following lab work before leaving today:  TTG, IGA  You will be hearing from Dr. Larae GroomsJacob's nurse, Alexia FreestonePatty, to arrange your endoscopic ultrasound

## 2016-03-19 LAB — TISSUE TRANSGLUTAMINASE, IGA: TISSUE TRANSGLUTAMINASE AB, IGA: 1 U/mL (ref <4)

## 2016-03-25 ENCOUNTER — Telehealth: Payer: Self-pay

## 2016-03-25 NOTE — Telephone Encounter (Signed)
-----   Message from Rachael Feeaniel P Jacobs, MD sent at 03/25/2016  7:08 AM EDT ----- Regarding: RE: EUS, potential ERCP Marian Grandt, She needs upper EUS +/- ERCP next available spot, +anesthesia assistance.  For abd pain, elevated liver tests  Thanks   ----- Message -----    From: Hilarie FredricksonJohn N Perry, MD    Sent: 03/24/2016  11:42 AM      To: Rachael Feeaniel P Jacobs, MD Subject: FW: EUS, potential ERCP                        This is the woman we discussed Friday. Thanks  JP ----- Message -----    From: Rachael Feeaniel P Jacobs, MD    Sent: 03/18/2016   8:24 PM      To: Hilarie FredricksonJohn N Perry, MD Subject: RE: EUS, potential ERCP                        Jonny RuizJohn,  I reviewed your note, her chart. I agree that EUS is probably more sensitive for smaller stones but MRCP is still very sensitive overall for CBD stones (at least 90%) and since it is non-invasive it should be the recommended first step. If it is negative and there is still a good suspicion for CBD stones (which given her history and transient, significant rise in liver tests I would say the suspicion is pretty high) then EUS +/- same day ERCP would be the next step.     Thanks  DJ   ----- Message -----    From: Hilarie FredricksonJohn N Perry, MD    Sent: 03/18/2016   4:32 PM      To: Rachael Feeaniel P Jacobs, MD Subject: EUS, potential ERCP                            Dan, I saw a nice young woman in the office today for abnormal liver tests associated with abdominal pain. Asymptomatic now with normal liver tests. Remote cholecystectomy. Postcholecystectomy ultrasound with 6 mm duct. I discussed with her in detail MRCP versus EUS with same-day conversion to ERCP with ductal clearance if necessary. I sent to my office note for review. Thanks Jonny RuizJohn

## 2016-03-26 ENCOUNTER — Other Ambulatory Visit: Payer: Self-pay

## 2016-03-26 DIAGNOSIS — R945 Abnormal results of liver function studies: Secondary | ICD-10-CM

## 2016-03-26 DIAGNOSIS — R1013 Epigastric pain: Secondary | ICD-10-CM

## 2016-03-26 DIAGNOSIS — R7989 Other specified abnormal findings of blood chemistry: Secondary | ICD-10-CM

## 2016-03-27 NOTE — Telephone Encounter (Signed)
Left message on machine to call back  

## 2016-03-31 ENCOUNTER — Telehealth: Payer: Self-pay | Admitting: Gastroenterology

## 2016-03-31 NOTE — Telephone Encounter (Signed)
EUS scheduled, pt instructed and medications reviewed.  Patient instructions mailed to home.  Patient to call with any questions or concerns.  

## 2016-03-31 NOTE — Telephone Encounter (Signed)
Regarding: RE: EUS, potential ERCP Shaunita Seney, She needs upper EUS +/- ERCP next available spot, +anesthesia assistance. For abd pain, elevated liver tests  Thanks   ----- Message -----  From: Hilarie FredricksonJohn N Perry, MD  Sent: 03/24/2016 11:42 AM  To: Rachael Feeaniel P Jacobs, MD Subject: FW: EUS, potential ERCP

## 2016-04-16 ENCOUNTER — Encounter (HOSPITAL_COMMUNITY): Payer: Self-pay | Admitting: *Deleted

## 2016-04-17 ENCOUNTER — Encounter (HOSPITAL_COMMUNITY): Payer: Self-pay | Admitting: *Deleted

## 2016-04-17 ENCOUNTER — Telehealth: Payer: Self-pay | Admitting: Gastroenterology

## 2016-04-17 NOTE — Telephone Encounter (Signed)
Left message on machine to call back  

## 2016-04-17 NOTE — Telephone Encounter (Signed)
Corrected instructions have been mailed to the home, pt aware

## 2016-05-01 ENCOUNTER — Ambulatory Visit (HOSPITAL_COMMUNITY): Payer: Managed Care, Other (non HMO) | Admitting: Certified Registered Nurse Anesthetist

## 2016-05-01 ENCOUNTER — Encounter (HOSPITAL_COMMUNITY): Payer: Self-pay | Admitting: Gastroenterology

## 2016-05-01 ENCOUNTER — Encounter (HOSPITAL_COMMUNITY)
Admission: RE | Disposition: A | Discharge status: Home / Self Care | Payer: Self-pay | Source: Ambulatory Visit | Attending: Gastroenterology

## 2016-05-01 ENCOUNTER — Ambulatory Visit (HOSPITAL_COMMUNITY)
Admission: RE | Admit: 2016-05-01 | Discharge: 2016-05-01 | Disposition: A | Discharge status: Home / Self Care | Payer: Managed Care, Other (non HMO) | Source: Ambulatory Visit | Attending: Gastroenterology | Admitting: Gastroenterology

## 2016-05-01 DIAGNOSIS — R1013 Epigastric pain: Secondary | ICD-10-CM

## 2016-05-01 DIAGNOSIS — R7989 Other specified abnormal findings of blood chemistry: Secondary | ICD-10-CM | POA: Diagnosis not present

## 2016-05-01 DIAGNOSIS — R945 Abnormal results of liver function studies: Secondary | ICD-10-CM

## 2016-05-01 DIAGNOSIS — Z9049 Acquired absence of other specified parts of digestive tract: Secondary | ICD-10-CM | POA: Diagnosis not present

## 2016-05-01 DIAGNOSIS — R935 Abnormal findings on diagnostic imaging of other abdominal regions, including retroperitoneum: Secondary | ICD-10-CM

## 2016-05-01 DIAGNOSIS — K838 Other specified diseases of biliary tract: Secondary | ICD-10-CM | POA: Diagnosis not present

## 2016-05-01 DIAGNOSIS — Z808 Family history of malignant neoplasm of other organs or systems: Secondary | ICD-10-CM | POA: Insufficient documentation

## 2016-05-01 DIAGNOSIS — R109 Unspecified abdominal pain: Secondary | ICD-10-CM | POA: Insufficient documentation

## 2016-05-01 DIAGNOSIS — R748 Abnormal levels of other serum enzymes: Secondary | ICD-10-CM

## 2016-05-01 HISTORY — PX: EUS: SHX5427

## 2016-05-01 LAB — COMPREHENSIVE METABOLIC PANEL
ALBUMIN: 4.2 g/dL (ref 3.5–5.0)
ALT: 12 U/L — ABNORMAL LOW (ref 14–54)
ANION GAP: 6 (ref 5–15)
AST: 18 U/L (ref 15–41)
Alkaline Phosphatase: 82 U/L (ref 38–126)
BUN: 11 mg/dL (ref 6–20)
CHLORIDE: 103 mmol/L (ref 101–111)
CO2: 28 mmol/L (ref 22–32)
Calcium: 9.1 mg/dL (ref 8.9–10.3)
Creatinine, Ser: 0.5 mg/dL (ref 0.44–1.00)
GFR calc non Af Amer: >60 mL/min (ref >60)
GLUCOSE: 100 mg/dL — AB (ref 65–99)
POTASSIUM: 4.5 mmol/L (ref 3.5–5.1)
SODIUM: 137 mmol/L (ref 135–145)
Total Bilirubin: 0.5 mg/dL (ref 0.3–1.2)
Total Protein: 7 g/dL (ref 6.5–8.1)

## 2016-05-01 LAB — LIPASE, BLOOD: Lipase: 24 U/L (ref 11–51)

## 2016-05-01 SURGERY — UPPER ENDOSCOPIC ULTRASOUND (EUS) LINEAR
Anesthesia: General

## 2016-05-01 MED ORDER — LACTATED RINGERS IV SOLN
INTRAVENOUS | Status: DC
  Administered 2016-05-01: 08:00:00 via INTRAVENOUS

## 2016-05-01 MED ORDER — METOCLOPRAMIDE HCL 5 MG/ML IJ SOLN
INTRAMUSCULAR | Status: DC | PRN
  Administered 2016-05-01: 10 mg via INTRAVENOUS

## 2016-05-01 MED ORDER — PROPOFOL 10 MG/ML IV BOLUS
INTRAVENOUS | Status: AC
  Filled 2016-05-01: qty 40

## 2016-05-01 MED ORDER — METOCLOPRAMIDE HCL 5 MG/ML IJ SOLN: 10.0000 mg | Freq: Once | INTRAMUSCULAR | Status: DC | PRN

## 2016-05-01 MED ORDER — FENTANYL CITRATE (PF) 100 MCG/2ML IJ SOLN
25.0000 ug | Freq: Once | INTRAMUSCULAR | Status: AC
  Administered 2016-05-01: 25 ug via INTRAVENOUS

## 2016-05-01 MED ORDER — SCOPOLAMINE 1 MG/3DAYS TD PT72
MEDICATED_PATCH | TRANSDERMAL | Status: AC
  Filled 2016-05-01: qty 1

## 2016-05-01 MED ORDER — METOCLOPRAMIDE HCL 5 MG/ML IJ SOLN
INTRAMUSCULAR | Status: AC
  Filled 2016-05-01: qty 2

## 2016-05-01 MED ORDER — SODIUM CHLORIDE 0.9 % IJ SOLN
INTRAMUSCULAR | Status: AC
  Filled 2016-05-01: qty 10

## 2016-05-01 MED ORDER — DIPHENHYDRAMINE HCL 50 MG/ML IJ SOLN
INTRAMUSCULAR | Status: DC | PRN
  Administered 2016-05-01: 12.5 mg via INTRAVENOUS

## 2016-05-01 MED ORDER — PROPOFOL 10 MG/ML IV BOLUS
INTRAVENOUS | Status: DC | PRN
  Administered 2016-05-01: 100 mg via INTRAVENOUS

## 2016-05-01 MED ORDER — ONDANSETRON HCL 4 MG/2ML IJ SOLN
INTRAMUSCULAR | Status: AC
  Filled 2016-05-01: qty 2

## 2016-05-01 MED ORDER — LIDOCAINE HCL (CARDIAC) 20 MG/ML IV SOLN
INTRAVENOUS | Status: AC
  Filled 2016-05-01: qty 5

## 2016-05-01 MED ORDER — LACTATED RINGERS IV SOLN: INTRAVENOUS | Status: DC

## 2016-05-01 MED ORDER — FENTANYL CITRATE (PF) 100 MCG/2ML IJ SOLN
INTRAMUSCULAR | Status: DC | PRN
  Administered 2016-05-01 (×2): 50 ug via INTRAVENOUS

## 2016-05-01 MED ORDER — SODIUM CHLORIDE 0.9 % IV SOLN: INTRAVENOUS | Status: DC

## 2016-05-01 MED ORDER — ONDANSETRON HCL 4 MG/2ML IJ SOLN
INTRAMUSCULAR | Status: DC | PRN
  Administered 2016-05-01: 4 mg via INTRAVENOUS

## 2016-05-01 MED ORDER — FENTANYL CITRATE (PF) 100 MCG/2ML IJ SOLN
INTRAMUSCULAR | Status: AC
  Filled 2016-05-01: qty 2

## 2016-05-01 MED ORDER — DIPHENHYDRAMINE HCL 50 MG/ML IJ SOLN
INTRAMUSCULAR | Status: AC
  Filled 2016-05-01: qty 1

## 2016-05-01 MED ORDER — LIDOCAINE HCL (CARDIAC) 20 MG/ML IV SOLN
INTRAVENOUS | Status: DC | PRN
  Administered 2016-05-01: 50 mg via INTRAVENOUS

## 2016-05-01 MED ORDER — SCOPOLAMINE 1 MG/3DAYS TD PT72
MEDICATED_PATCH | TRANSDERMAL | Status: DC | PRN
  Administered 2016-05-01: 1 via TRANSDERMAL

## 2016-05-01 MED ORDER — DEXAMETHASONE SODIUM PHOSPHATE 4 MG/ML IJ SOLN
INTRAMUSCULAR | Status: DC | PRN
  Administered 2016-05-01: 10 mg via INTRAVENOUS

## 2016-05-01 MED ORDER — DEXAMETHASONE SODIUM PHOSPHATE 10 MG/ML IJ SOLN
INTRAMUSCULAR | Status: AC
  Filled 2016-05-01: qty 1

## 2016-05-01 MED ORDER — SUCCINYLCHOLINE CHLORIDE 20 MG/ML IJ SOLN
INTRAMUSCULAR | Status: DC | PRN
  Administered 2016-05-01: 100 mg via INTRAVENOUS

## 2016-05-01 MED ORDER — EPHEDRINE SULFATE 50 MG/ML IJ SOLN
INTRAMUSCULAR | Status: AC
  Filled 2016-05-01: qty 1

## 2016-05-01 NOTE — Anesthesia Preprocedure Evaluation (Signed)
Anesthesia Evaluation  Patient identified by MRN, date of birth, ID band Patient awake    Reviewed: Allergy & Precautions, NPO status , Patient's Chart, lab work & pertinent test results  Airway Mallampati: II  TM Distance: >3 FB Neck ROM: Full    Dental no notable dental hx.    Pulmonary asthma ,    Pulmonary exam normal breath sounds clear to auscultation       Cardiovascular negative cardio ROS Normal cardiovascular exam Rhythm:Regular Rate:Normal     Neuro/Psych negative neurological ROS  negative psych ROS   GI/Hepatic negative GI ROS, Neg liver ROS,   Endo/Other  negative endocrine ROS  Renal/GU negative Renal ROS  negative genitourinary   Musculoskeletal negative musculoskeletal ROS (+)   Abdominal   Peds negative pediatric ROS (+)  Hematology negative hematology ROS (+)   Anesthesia Other Findings   Reproductive/Obstetrics negative OB ROS                             Anesthesia Physical Anesthesia Plan  ASA: I  Anesthesia Plan: General   Post-op Pain Management:    Induction: Intravenous  Airway Management Planned: Oral ETT  Additional Equipment:   Intra-op Plan:   Post-operative Plan: Extubation in OR  Informed Consent: I have reviewed the patients History and Physical, chart, labs and discussed the procedure including the risks, benefits and alternatives for the proposed anesthesia with the patient or authorized representative who has indicated his/her understanding and acceptance.   Dental advisory given  Plan Discussed with: CRNA  Anesthesia Plan Comments:         Anesthesia Quick Evaluation

## 2016-05-01 NOTE — Anesthesia Procedure Notes (Signed)
Procedure Name: Intubation Date/Time: 05/01/2016 8:21 AM Performed by: Ludwig LeanJONES, Nylia Gavina C Pre-anesthesia Checklist: Patient identified, Emergency Drugs available, Suction available and Patient being monitored Patient Re-evaluated:Patient Re-evaluated prior to inductionOxygen Delivery Method: Circle system utilized Preoxygenation: Pre-oxygenation with 100% oxygen Intubation Type: IV induction Ventilation: Mask ventilation without difficulty Laryngoscope Size: Mac and 3 Grade View: Grade I Tube type: Oral Tube size: 7.0 mm Number of attempts: 1 Airway Equipment and Method: Oral airway Placement Confirmation: ETT inserted through vocal cords under direct vision,  positive ETCO2 and breath sounds checked- equal and bilateral Secured at: 21 cm Tube secured with: Tape Dental Injury: Teeth and Oropharynx as per pre-operative assessment

## 2016-05-01 NOTE — H&P (Signed)
  HPI: This is a woman with abd pain, elevated liver tests transiently.  Chief complaint is abd pain, transiently elevated liver tests   Past Medical History  Diagnosis Date  . History of kidney stones     kidney stones x2 episodes-last 1'17 -litho done  . Asthma     exercise-induced  . PONV (postoperative nausea and vomiting)   . Abnormal LFTs     being evaluated for this  . Migraine     monthly with menses    Past Surgical History  Procedure Laterality Date  . Kidney stone surgery  2009  . Wisdom tooth extraction  10/2002  . Lithotripsy  12/2015  . Cholecystectomy  2003    laparoscopic    No current facility-administered medications for this encounter.    Allergies as of 03/26/2016 - Review Complete 03/18/2016  Allergen Reaction Noted  . Hydrocodone Nausea And Vomiting 02/15/2012    Family History  Problem Relation Age of Onset  . Other Neg Hx   . Cancer Paternal Grandfather     liver  . Alcohol abuse Paternal Grandfather   . Hyperlipidemia Mother   . Diabetes Father   . Hypertension Father   . Alcohol abuse Father   . Alzheimer's disease Maternal Grandmother   . Arthritis Maternal Grandmother   . Parkinson's disease Maternal Grandfather   . Stroke Paternal Grandmother   . Heart disease Paternal Grandmother     Social History   Social History  . Marital Status: Married    Spouse Name: N/A  . Number of Children: N/A  . Years of Education: N/A   Occupational History  . Not on file.   Social History Main Topics  . Smoking status: Never Smoker   . Smokeless tobacco: Never Used  . Alcohol Use: 0.0 oz/week    0 Standard drinks or equivalent per week     Comment: occasional  . Drug Use: No  . Sexual Activity: Yes   Other Topics Concern  . Not on file   Social History Narrative   2014- daughter   2016- daugher   Married   Works as a Chief Strategy Officercosmetologist   Completed college   Enjoys spending time with friends, reading.    Completed college      Physical Exam: LMP 04/16/2016  Breastfeeding? No Constitutional: generally well-appearing Psychiatric: alert and oriented x3 Abdomen: soft, nontender, nondistended, no obvious ascites, no peritoneal signs, normal bowel sounds   Assessment and plan: 30 y.o. female with CBD stone  For upper EUS +/- ERCP   Rob Buntinganiel Jacobs, MD [facility] 05/01/2016, 7:18 AM

## 2016-05-01 NOTE — Discharge Instructions (Signed)

## 2016-05-01 NOTE — Transfer of Care (Signed)
Immediate Anesthesia Transfer of Care Note  Patient: Carrie Combs  Procedure(s) Performed: Procedure(s): UPPER ENDOSCOPIC ULTRASOUND (EUS) LINEAR (N/A) ENDOSCOPIC RETROGRADE CHOLANGIOPANCREATOGRAPHY (ERCP) WITH PROPOFOL (N/A)  Patient Location: PACU  Anesthesia Type:General  Level of Consciousness: Patient easily awoken, sedated, comfortable, cooperative, following commands, responds to stimulation.   Airway & Oxygen Therapy: Patient spontaneously breathing, ventilating well, oxygen via simple oxygen mask.  Post-op Assessment: Report given to PACU RN, vital signs reviewed and stable, moving all extremities.   Post vital signs: Reviewed and stable.  Complications: No apparent anesthesia complications

## 2016-05-01 NOTE — Progress Notes (Signed)
Notified Dr Jacobs that Carrie Hartiganpatient started having post procedure pain in her right upper back that is "identitical to the pain she has when she has to come to the ER".  Ordered CMET and Lipase level to be drawn.  Asked if anything could be given for pain and ordered 25mg  Fentanyl IV.  Will continue to monitor and assess.

## 2016-05-01 NOTE — Op Note (Signed)
[facility] Patient Name: Carrie Combs Procedure Date: 05/01/2016 MRN: [id number] Attending MD: Rachael Fee , MD Date of Birth: [birth year] CSN: [id number] Age: 30 Admit Type: Outpatient Procedure:                Upper EUS Indications:              Abnormal ultrasound of the abdomen, Elevated liver                            enzymes Providers:                Rachael Fee, MD Referring MD:             Yancey Flemings, MD Medicines:                General Anesthesia Complications:            No immediate complications. Estimated blood loss:                            None. Estimated Blood Loss:     Estimated blood loss: none. Procedure:                Pre-Anesthesia Assessment:                           - Prior to the procedure, a History and Physical                            was performed, and patient medications and                            allergies were reviewed. The patient's tolerance of                            previous anesthesia was also reviewed. The risks                            and benefits of the procedure and the sedation                            options and risks were discussed with the patient.                            All questions were answered, and informed consent                            was obtained. Prior Anticoagulants: The patient has                            taken no previous anticoagulant or antiplatelet                            agents. ASA Grade Assessment: I - A normal, healthy                            patient. After  reviewing the risks and benefits,                            the patient was deemed in satisfactory condition to                            undergo the procedure.                           After obtaining informed consent, the endoscope was                            passed under direct vision. Throughout the                            procedure, the patient's blood pressure, pulse, and         oxygen saturations were monitored continuously. The                            [id number] ([id number]) scope was introduced through                            the mouth, and advanced to the second part of                            duodenum. The upper EUS was accomplished without                            difficulty. The patient tolerated the procedure                            well. Scope In: Scope Out: Findings:      Endoscopic Finding :      The examined esophagus was endoscopically normal.      The entire examined stomach was endoscopically normal.      The examined duodenum was endoscopically normal (including normal       appearing major papilla, inspected with side viewing ERCP scope)      Endosonographic Finding :      1. Evidence of a previous cholecystectomy      2. There was diffuse dilation of the main bile duct which measured up to       7.2 mm and tapered smoothly into the head of pancraes. The bile duct       contained no stones, debris.      3. Pancreas was normal      4. Main pancreatic duct was normal.      5. No peripancreatic, portal adenopathy.      6. Limited views of liver, spleen, portal and splenic vessels were all       normal. Impression:               -Main bile duct was dilated to 7.67mm, tapered                            smoothly into the head of the pancreas and  contained no stones, debris. This dilation is                            probably physiologic response to remote                            cholecystectomy.                           -I will pass this on to Dr. Marina GoodellPerry to consider other                            workup of your intermittently elevated liver tests,                            abd pains. Moderate Sedation:      N/A- Per Anesthesia Care Recommendation:           - Patient has a contact number available for                            emergencies. The signs and symptoms of potential                             delayed complications were discussed with the                            patient. Return to normal activities tomorrow.                            Written discharge instructions were provided to the                            patient.                           - Resume previous diet. Procedure Code(s):        --- Professional ---                           [id number], Esophagogastroduodenoscopy, flexible,                            transoral; with endoscopic ultrasound examination                            limited to the esophagus, stomach or duodenum, and                            adjacent structures Diagnosis Code(s):        --- Professional ---                           Z90.49, Acquired absence of other specified parts                            of  digestive tract                           R74.8, Abnormal levels of other serum enzymes                           K83.8, Other specified diseases of biliary tract                           R93.5, Abnormal findings on diagnostic imaging of                            other abdominal regions, including retroperitoneum CPT copyright 2016 [employer]. All rights reserved. The codes documented in this report are preliminary and upon coder review may  be revised to meet current compliance requirements. Rachael Fee, MD 05/01/2016 8:52:37 AM This report has been signed electronically. Number of Addenda: 0

## 2016-05-01 NOTE — Anesthesia Postprocedure Evaluation (Signed)
Anesthesia Post Note  Patient: Carrie Combs  Procedure(s) Performed: Procedure(s) (LRB): UPPER ENDOSCOPIC ULTRASOUND (EUS) LINEAR (N/A) ENDOSCOPIC RETROGRADE CHOLANGIOPANCREATOGRAPHY (ERCP) WITH PROPOFOL (N/A)  Patient location during evaluation: Endoscopy Anesthesia Type: MAC Level of consciousness: awake and alert Pain management: pain level controlled Vital Signs Assessment: post-procedure vital signs reviewed and stable Respiratory status: spontaneous breathing, nonlabored ventilation, respiratory function stable and patient connected to nasal cannula oxygen Cardiovascular status: stable and blood pressure returned to baseline Anesthetic complications: no    Last Vitals:  Filed Vitals:   05/01/16 1000 05/01/16 1010  BP: 111/76 117/82  Pulse: 80 110  Temp:    Resp: 23 27    Last Pain:  Filed Vitals:   05/01/16 1013  PainSc: 0-No pain                 Phillips Groutarignan, Nayib Remer

## 2016-05-02 ENCOUNTER — Encounter (HOSPITAL_COMMUNITY): Payer: Self-pay | Admitting: Gastroenterology

## 2016-05-07 ENCOUNTER — Telehealth: Payer: Self-pay

## 2016-05-07 NOTE — Telephone Encounter (Signed)
Spoke with pt and she is aware.

## 2016-05-07 NOTE — Telephone Encounter (Signed)
-----   Message from Hilarie FredricksonJohn N Perry, MD sent at 05/01/2016 11:47 AM EDT ----- Carrie MangesLinda, Contact this patient tomorrow and let her know that I am up-to-date on her EUS results. I would like to see her in the office in 1 year. If she has interval problems, she should feel free to contact us. Thanks ----- Message -----    From: Rachael Feeaniel P Jacobs, MD    Sent: 05/01/2016   8:53 AM      To: Hilarie FredricksonJohn N Perry, MD  John, Just completed EUS.  The CBD was dilated to 7.482mm but no sign of stones, debris.  Probably just physiologic dilation post cholecystectomy.  She knows you'll be contacting her with recommendations of further testing, if appropriate.  EUS is limited to extrahepatic bile ducts, could consider MRi liver with MRCP for intrahepatic inspection, ?odd presentation of PSC perhaps.  Versus referral to tertiary center for SOD workup.   Thanks DJ

## 2016-07-15 ENCOUNTER — Encounter: Payer: Self-pay | Admitting: Family

## 2016-09-16 IMAGING — CR DG ABDOMEN 1V
2 series · 2 of 2 positions shown · non-contrast
Comparison: Abdominal radiograph dated 12/14/2015 and CT dated
11/14/2015

CLINICAL DATA: 29-year-old female with left renal stone pre
lithotripsy radiograph.

EXAM:
ABDOMEN - 1 VIEW

[t abdomen supine (1 of 2)]
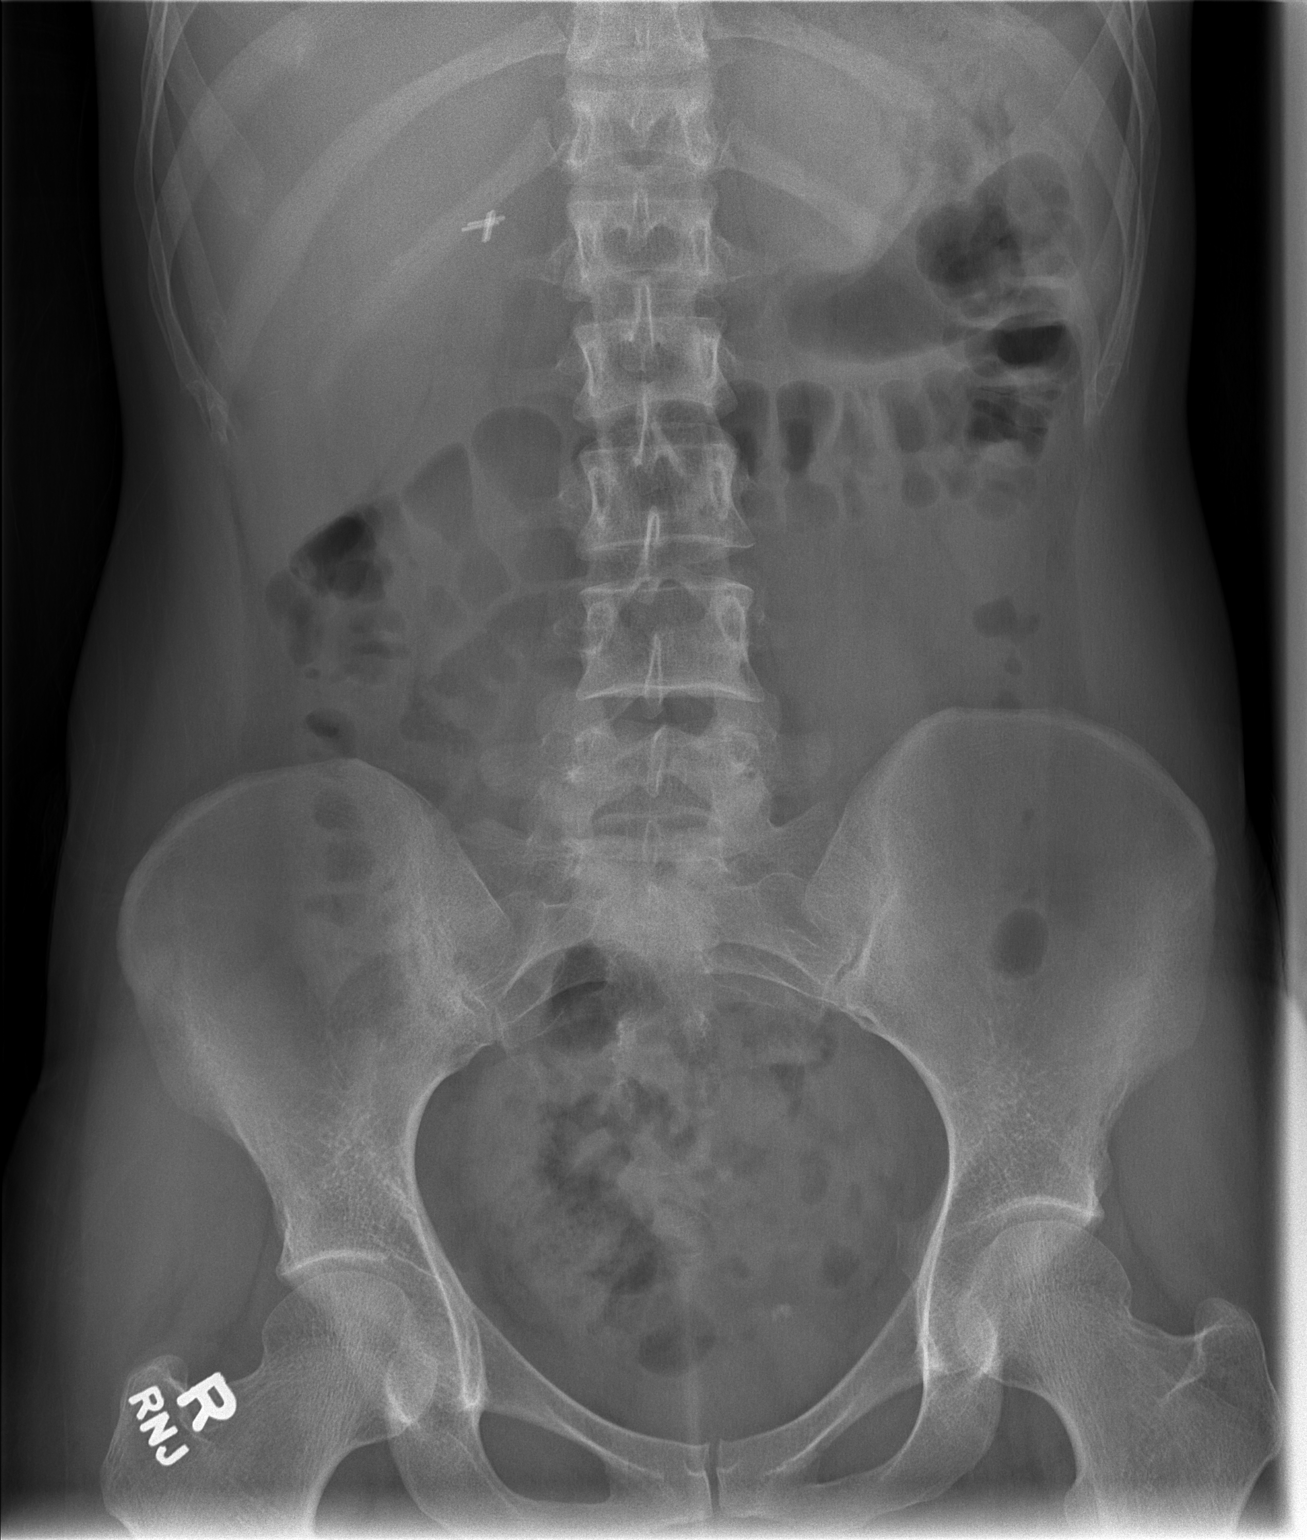

[t abdomen supine (2 of 2)]
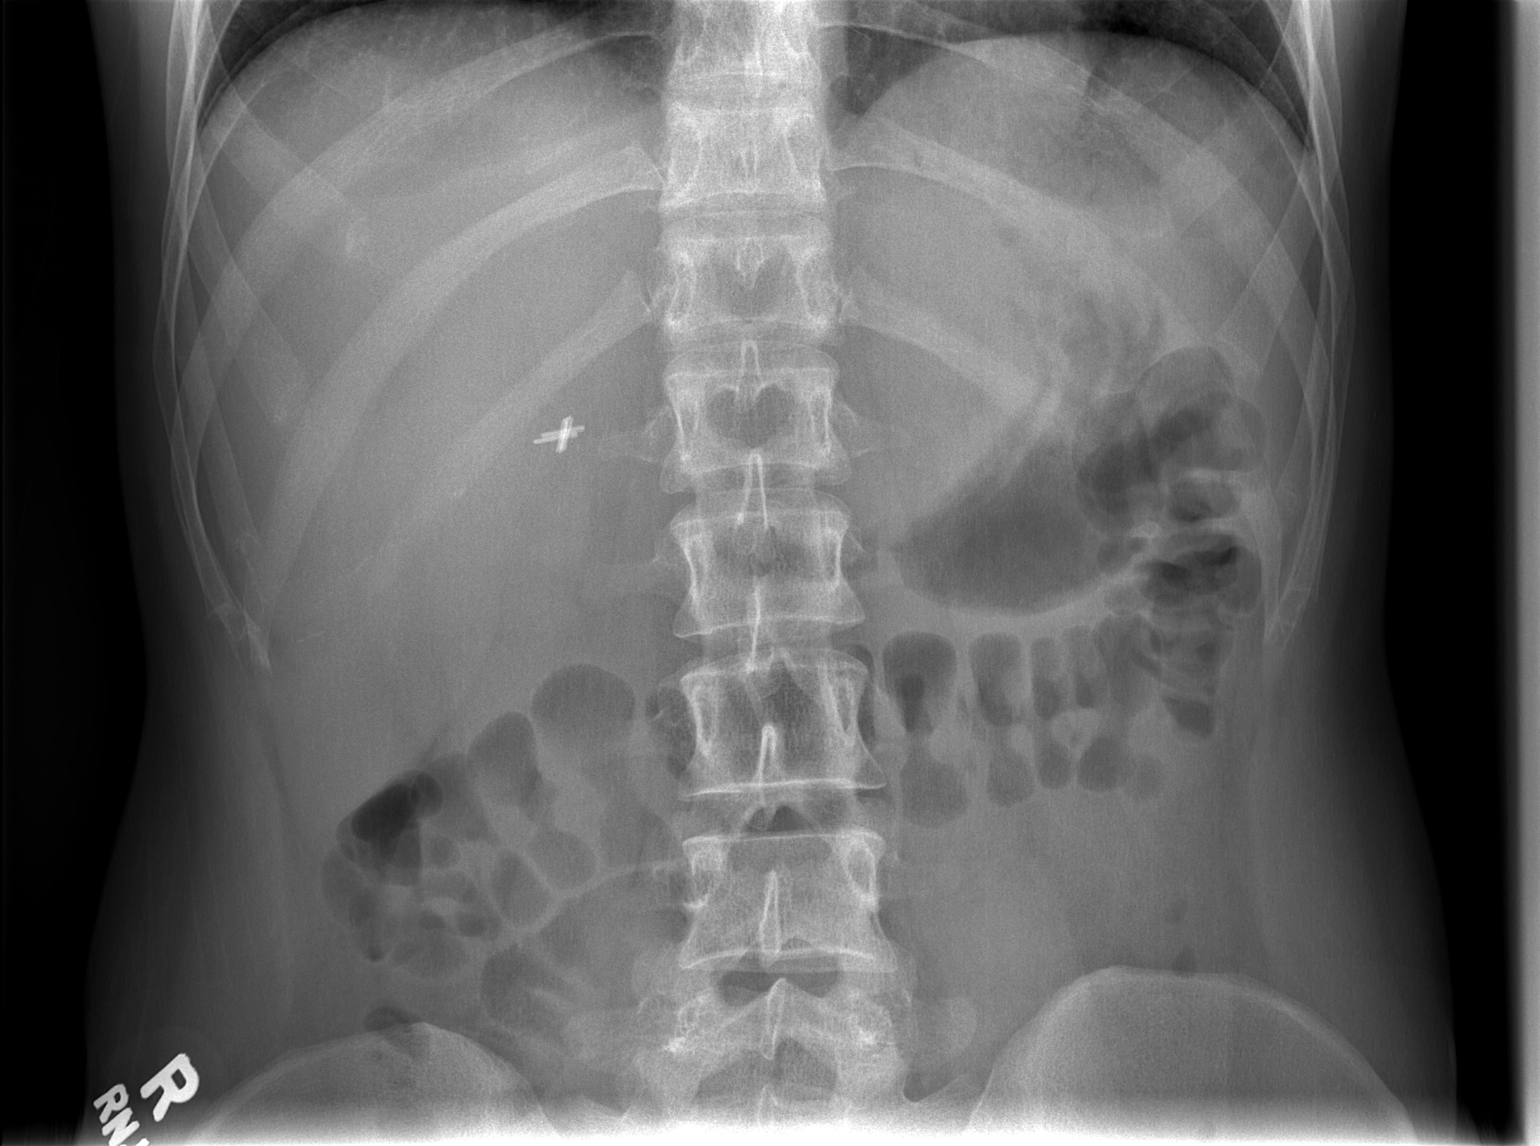

[2 of 2 positions shown; findings below may reference images not displayed]

FINDINGS: There is no evidence of bowel obstruction. No free air. No
radiopaque calculi identified in the region of the kidneys. There is
a 5 mm radiopaque focus in the left hemipelvis which is similar to
the prior radiograph and may represent a stone in the left distal
ureter, left UVJ or stone within the urinary bladder. Right upper
quadrant cholecystectomy clips noted. The osseous structures and
soft tissues are unremarkable.
IMPRESSION: A 5 mm radiopaque focus in the left hemipelvis may represent a
distal ureteral or bladder calculus.

## 2017-03-11 ENCOUNTER — Encounter: Payer: Self-pay | Admitting: Family

## 2017-03-11 ENCOUNTER — Ambulatory Visit (INDEPENDENT_AMBULATORY_CARE_PROVIDER_SITE_OTHER): Payer: Managed Care, Other (non HMO) | Admitting: Family

## 2017-03-11 DIAGNOSIS — R51 Headache: Secondary | ICD-10-CM | POA: Diagnosis not present

## 2017-03-11 DIAGNOSIS — R519 Headache, unspecified: Secondary | ICD-10-CM

## 2017-03-11 DIAGNOSIS — Z0000 Encounter for general adult medical examination without abnormal findings: Secondary | ICD-10-CM

## 2017-03-11 LAB — URINALYSIS, ROUTINE W REFLEX MICROSCOPIC
BILIRUBIN URINE: NEGATIVE
Hgb urine dipstick: NEGATIVE
Ketones, ur: NEGATIVE
Leukocytes, UA: NEGATIVE
NITRITE: NEGATIVE
PH: 7 (ref 5.0–8.0)
SPECIFIC GRAVITY, URINE: 1.01 (ref 1.000–1.030)
Total Protein, Urine: NEGATIVE
Urine Glucose: NEGATIVE
Urobilinogen, UA: 0.2 (ref 0.0–1.0)

## 2017-03-11 LAB — LIPID PANEL
CHOLESTEROL: 200 mg/dL (ref 0–200)
HDL: 58.1 mg/dL (ref >39.00)
LDL Cholesterol: 130 mg/dL — ABNORMAL HIGH (ref 0–99)
NonHDL: 142.22
TRIGLYCERIDES: 59 mg/dL (ref 0.0–149.0)
Total CHOL/HDL Ratio: 3
VLDL: 11.8 mg/dL (ref 0.0–40.0)

## 2017-03-11 LAB — CBC WITH DIFFERENTIAL/PLATELET
Basophils Absolute: 0 10*3/uL (ref 0.0–0.1)
Basophils Relative: 0.6 % (ref 0.0–3.0)
EOS PCT: 3.2 % (ref 0.0–5.0)
Eosinophils Absolute: 0.2 10*3/uL (ref 0.0–0.7)
HCT: 42.5 % (ref 36.0–46.0)
Hemoglobin: 14 g/dL (ref 12.0–15.0)
LYMPHS ABS: 2.3 10*3/uL (ref 0.7–4.0)
Lymphocytes Relative: 37.3 % (ref 12.0–46.0)
MCHC: 33 g/dL (ref 30.0–36.0)
MCV: 91.4 fl (ref 78.0–100.0)
MONO ABS: 0.4 10*3/uL (ref 0.1–1.0)
MONOS PCT: 7.1 % (ref 3.0–12.0)
NEUTROS PCT: 51.8 % (ref 43.0–77.0)
Neutro Abs: 3.2 10*3/uL (ref 1.4–7.7)
PLATELETS: 282 10*3/uL (ref 150.0–400.0)
RBC: 4.65 Mil/uL (ref 3.87–5.11)
RDW: 13 % (ref 11.5–15.5)
WBC: 6.2 10*3/uL (ref 4.0–10.5)

## 2017-03-11 LAB — HEPATIC FUNCTION PANEL
ALBUMIN: 4.6 g/dL (ref 3.5–5.2)
ALT: 12 U/L (ref 0–35)
AST: 13 U/L (ref 0–37)
Alkaline Phosphatase: 64 U/L (ref 39–117)
BILIRUBIN TOTAL: 0.6 mg/dL (ref 0.2–1.2)
Bilirubin, Direct: 0.1 mg/dL (ref 0.0–0.3)
Total Protein: 7.4 g/dL (ref 6.0–8.3)

## 2017-03-11 LAB — BASIC METABOLIC PANEL
BUN: 8 mg/dL (ref 6–23)
CO2: 28 mEq/L (ref 19–32)
Calcium: 9.6 mg/dL (ref 8.4–10.5)
Chloride: 101 mEq/L (ref 96–112)
Creatinine, Ser: 0.57 mg/dL (ref 0.40–1.20)
GFR: 129.99 mL/min (ref >60.00)
GLUCOSE: 88 mg/dL (ref 70–99)
Potassium: 4.6 mEq/L (ref 3.5–5.1)
SODIUM: 135 meq/L (ref 135–145)

## 2017-03-11 NOTE — Progress Notes (Signed)
Subjective:    Patient ID: Carrie Combs, female    DOB: 08-31-1986, 31 y.o.   MRN: [id number]  HPI  Patient presents today for complete physical.  Immunizations: tdap 2013 Diet:  Gluten free Exercise: not exercising, plans to join the gym Pap Smear: 02/13/17  Reports GYN checked testosterone and TSH and was reportedly normal.   Seeing dermatologist Dr. Lenis Dickinson for Back acne.  Considering accutane. Husband has had a vasectomy.   Headaches-  She is requesting a referral to an allergist due to persistent headaches. She notes that HA's have been worse since her allergies have flared.  She also wonders if she may have a food allergy that triggers her headaches. She has gone gluten free without improvement.   Review of Systems  Constitutional: Negative for unexpected weight change.  HENT: Positive for rhinorrhea. Negative for hearing loss.   Eyes: Negative for visual disturbance.  Respiratory: Negative for cough.   Cardiovascular: Negative for leg swelling.  Gastrointestinal: Negative for constipation and diarrhea.  Genitourinary: Negative for dysuria, frequency and menstrual problem.  Musculoskeletal: Negative for arthralgias and myalgias.  Skin: Negative for rash.  Neurological:       See HPI  Hematological: Negative for adenopathy.  Psychiatric/Behavioral:       Denies depression/anxiety    Past Medical History:  Diagnosis Date  . Abnormal LFTs    being evaluated for this  . Asthma    exercise-induced  . History of kidney stones    kidney stones x2 episodes-last 1'17 -litho done  . Migraine    monthly with menses  . PONV (postoperative nausea and vomiting)      Social History   Social History  . Marital status: Married    Spouse name: N/A  . Number of children: N/A  . Years of education: N/A   Occupational History  . Not on file.   Social History Main Topics  . Smoking status: Never Smoker  . Smokeless tobacco: Never Used  . Alcohol use 0.0 oz/week   Comment: occasional  . Drug use: No  . Sexual activity: Yes   Other Topics Concern  . 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    Completed college    Past Surgical History:  Procedure Laterality Date  . CHOLECYSTECTOMY  2003   laparoscopic  . EUS N/A 05/01/2016   Procedure: UPPER ENDOSCOPIC ULTRASOUND (EUS) LINEAR;  Surgeon: Rachael Fee, MD;  Location: [facility] END[facility]COPY;  Service: Endoscopy;  Laterality: N/A;  . KIDNEY STONE SURGERY  2009  . LITHOTRIPSY  12/2015  . WISDOM TOOTH EXTRACTION  10/2002    Family History  Problem Relation Age of Onset  . Cancer Paternal Grandfather     liver  . Alcohol abuse Paternal Grandfather   . Hyperlipidemia Mother   . Diabetes Father   . Hypertension Father   . Alcohol abuse Father   . Frontotemporal dementia Father     tentative (still undergoing work up -- 03/2017)  . Alzheimer's disease Maternal Grandmother   . Arthritis Maternal Grandmother   . Parkinson's disease Maternal Grandfather   . Stroke Paternal Grandmother   . Heart disease Paternal Grandmother   . Other Neg Hx     Allergies  Allergen Reactions  . Hydrocodone Nausea And Vomiting    No current outpatient prescriptions  on file prior to visit.   No current facility-administered medications on file prior to visit.     BP 104/74 (BP Location: Right Arm, Cuff Size: Normal)   Pulse 75   Temp 98 F (36.7 C) (Oral)   Resp 16   Ht  (1.626 m)   Wt 102 lb 3.2 oz (46.4 kg)   LMP 03/01/2017   SpO2 100% Comment: room air  BMI 17.54 kg/m       Objective:   Physical Exam Physical Exam  Constitutional: She is oriented to person, place, and time. She appears well-developed and well-nourished. No distress.  HENT:  Head: Normocephalic and atraumatic.  Right Ear: Tympanic membrane and ear canal normal.  Left Ear: Tympanic membrane  and ear canal normal.  Mouth/Throat: Oropharynx is clear and moist.  Eyes: Pupils are equal, round, and reactive to light. No scleral icterus.  Neck: Normal range of motion. No thyromegaly present.  Cardiovascular: Normal rate and regular rhythm.   No murmur heard. Pulmonary/Chest: Effort normal and breath sounds normal. No respiratory distress. He has no wheezes. She has no rales. She exhibits no tenderness.  Abdominal: Soft. Bowel sounds are normal. She exhibits no distension and no mass. There is no tenderness. There is no rebound and no guarding.  Musculoskeletal: She exhibits no edema.  Lymphadenopathy:    She has no cervical adenopathy.  Neurological: She is alert and oriented to person, place, and time. She has normal patellar reflexes. She exhibits normal muscle tone. Coordination normal.  Skin: Skin is warm and dry.  Psychiatric: She has a normal mood and affect. Her behavior is normal. Judgment and thought content normal.  Breast/pelvic: deferred        Assessment & Plan:     Preventative care- immunizations/pap up to date.  Will obtain routine lab work.  She is a little underweight.  Advised pt as follows:  Add healthy high calorie snack such as nuts/dried fruit to help bring your weight up a little. Don't skip breakfast. Add some regular exercise.   Headaches- we discussed adding an antihistamine once daily such as zyrtec. Will also place referral to allergist at patient request.     Assessment & Plan:

## 2017-03-11 NOTE — Patient Instructions (Addendum)
Please complete lab work prior to leaving. Add healthy high calorie snack such as nuts/dried fruit to help bring your weight up a little. Don't skip breakfast. Add some regular exercise.

## 2017-03-11 NOTE — Progress Notes (Signed)
Pre visit review using our clinic review tool, if applicable. No additional management support is needed unless otherwise documented below in the visit note. 

## 2017-03-12 ENCOUNTER — Encounter: Payer: Self-pay | Admitting: Family

## 2017-03-13 ENCOUNTER — Encounter (HOSPITAL_BASED_OUTPATIENT_CLINIC_OR_DEPARTMENT_OTHER): Payer: Self-pay | Admitting: Emergency Medicine

## 2017-03-13 ENCOUNTER — Emergency Department (HOSPITAL_BASED_OUTPATIENT_CLINIC_OR_DEPARTMENT_OTHER)
Admission: EM | Admit: 2017-03-13 | Discharge: 2017-03-13 | Disposition: A | Discharge status: Home / Self Care | Payer: Managed Care, Other (non HMO) | Attending: Emergency Medicine | Admitting: Emergency Medicine

## 2017-03-13 DIAGNOSIS — D72829 Elevated white blood cell count, unspecified: Secondary | ICD-10-CM | POA: Diagnosis not present

## 2017-03-13 DIAGNOSIS — A084 Viral intestinal infection, unspecified: Secondary | ICD-10-CM | POA: Insufficient documentation

## 2017-03-13 DIAGNOSIS — R112 Nausea with vomiting, unspecified: Secondary | ICD-10-CM | POA: Diagnosis present

## 2017-03-13 DIAGNOSIS — R1013 Epigastric pain: Secondary | ICD-10-CM

## 2017-03-13 DIAGNOSIS — D72828 Other elevated white blood cell count: Secondary | ICD-10-CM

## 2017-03-13 DIAGNOSIS — R197 Diarrhea, unspecified: Secondary | ICD-10-CM

## 2017-03-13 DIAGNOSIS — J45909 Unspecified asthma, uncomplicated: Secondary | ICD-10-CM | POA: Diagnosis not present

## 2017-03-13 HISTORY — DX: Calculus of kidney: N20.0

## 2017-03-13 LAB — COMPREHENSIVE METABOLIC PANEL
ALK PHOS: 70 U/L (ref 38–126)
ALT: 13 U/L — ABNORMAL LOW (ref 14–54)
ANION GAP: 8 (ref 5–15)
AST: 16 U/L (ref 15–41)
Albumin: 4.9 g/dL (ref 3.5–5.0)
BUN: 14 mg/dL (ref 6–20)
CALCIUM: 9.4 mg/dL (ref 8.9–10.3)
CO2: 25 mmol/L (ref 22–32)
Chloride: 103 mmol/L (ref 101–111)
Creatinine, Ser: 0.48 mg/dL (ref 0.44–1.00)
GFR calc non Af Amer: >60 mL/min (ref >60)
Glucose, Bld: 103 mg/dL — ABNORMAL HIGH (ref 65–99)
Potassium: 3.9 mmol/L (ref 3.5–5.1)
SODIUM: 136 mmol/L (ref 135–145)
Total Bilirubin: 0.6 mg/dL (ref 0.3–1.2)
Total Protein: 7.9 g/dL (ref 6.5–8.1)

## 2017-03-13 LAB — URINALYSIS, ROUTINE W REFLEX MICROSCOPIC
Bilirubin Urine: NEGATIVE
Glucose, UA: NEGATIVE mg/dL
Hgb urine dipstick: NEGATIVE
Ketones, ur: NEGATIVE mg/dL
Leukocytes, UA: NEGATIVE
Nitrite: NEGATIVE
Protein, ur: NEGATIVE mg/dL
Specific Gravity, Urine: 1.018 (ref 1.005–1.030)
pH: 8 (ref 5.0–8.0)

## 2017-03-13 LAB — CBC WITH DIFFERENTIAL/PLATELET
Basophils Absolute: 0 10*3/uL (ref 0.0–0.1)
Basophils Relative: 0 %
EOS ABS: 0.1 10*3/uL (ref 0.0–0.7)
Eosinophils Relative: 1 %
HCT: 43.1 % (ref 36.0–46.0)
HEMOGLOBIN: 14.8 g/dL (ref 12.0–15.0)
LYMPHS ABS: 1 10*3/uL (ref 0.7–4.0)
Lymphocytes Relative: 6 %
MCH: 31 pg (ref 26.0–34.0)
MCHC: 34.3 g/dL (ref 30.0–36.0)
MCV: 90.2 fL (ref 78.0–100.0)
MONOS PCT: 6 %
Monocytes Absolute: 1 10*3/uL (ref 0.1–1.0)
NEUTROS PCT: 87 %
Neutro Abs: 14.2 10*3/uL — ABNORMAL HIGH (ref 1.7–7.7)
Platelets: 275 10*3/uL (ref 150–400)
RBC: 4.78 MIL/uL (ref 3.87–5.11)
RDW: 12.2 % (ref 11.5–15.5)
WBC: 16.2 10*3/uL — ABNORMAL HIGH (ref 4.0–10.5)

## 2017-03-13 LAB — LIPASE, BLOOD: Lipase: 17 U/L (ref 11–51)

## 2017-03-13 LAB — PREGNANCY, URINE: Preg Test, Ur: NEGATIVE

## 2017-03-13 MED ORDER — PROMETHAZINE HCL 25 MG/ML IJ SOLN
25.0000 mg | Freq: Once | INTRAMUSCULAR | Status: AC
  Administered 2017-03-13: 25 mg via INTRAVENOUS
  Filled 2017-03-13: qty 1

## 2017-03-13 MED ORDER — SODIUM CHLORIDE 0.9 % IV BOLUS (SEPSIS)
2000.0000 mL | Freq: Once | INTRAVENOUS | Status: AC
  Administered 2017-03-13: 2000 mL via INTRAVENOUS

## 2017-03-13 MED ORDER — ONDANSETRON HCL 4 MG/2ML IJ SOLN
4.0000 mg | Freq: Once | INTRAMUSCULAR | Status: AC
  Administered 2017-03-13: 4 mg via INTRAVENOUS
  Filled 2017-03-13: qty 2

## 2017-03-13 MED ORDER — KETOROLAC TROMETHAMINE 30 MG/ML IJ SOLN
15.0000 mg | Freq: Once | INTRAMUSCULAR | Status: AC
  Administered 2017-03-13: 15 mg via INTRAVENOUS
  Filled 2017-03-13: qty 1

## 2017-03-13 MED ORDER — PROMETHAZINE HCL 25 MG PO TABS: 25.0000 mg | ORAL_TABLET | Freq: Four times a day (QID) | ORAL | 0 refills | Status: DC | PRN

## 2017-03-13 MED FILL — PROMETHAZINE 25 MG TABLET: 25 | 3 days supply | Qty: 10 | Fill #0

## 2017-03-13 NOTE — ED Notes (Signed)
Ginger Ale provided for po challenge 

## 2017-03-13 NOTE — ED Triage Notes (Signed)
N/V/D since 6 am.  5 times.  Pt c/o generalized ache in abdomen due to vomiting.  Left flank pain today.

## 2017-03-13 NOTE — Discharge Instructions (Signed)
Use phenergan as prescribed, as needed for nausea. Alternate between tylenol and motrin as needed for pain. Stay well hydrated with small sips of fluids throughout the day. Follow a BRAT (banana-rice-applesauce-toast) diet as described below for the next 24-48 hours. The 'BRAT' diet is suggested, then progress to diet as tolerated as symptoms abate. May consider over the counter imodium if diarrhea persists. Call your regular doctor if bloody stools, persistent diarrhea, vomiting, fever or abdominal pain. Follow up with your regular doctor in 5-7 days for recheck of symptoms. Return to ER for changing or worsening of symptoms.

## 2017-03-13 NOTE — ED Provider Notes (Signed)
[facility] Provider Note   CSN: [id number] Arrival date & time: 03/13/17  0845     History   Chief Complaint Chief Complaint  Patient presents with  . Abdominal Pain    HPI Carrie Combs is a 31 y.o. female with a PMHx of nephrolithiasis, asthma, migraines, and abnormal LFTs, with a PSHx of cholecystectomy and kidney stone surgery/lithotripsy, who presents to the ED with complaints of 3 hours of nausea vomiting and diarrhea. States that her husband was ill with the exact same symptoms last night and she believes that she caught whatever GI bug he had. She reports that she's had 5 episodes of nonbloody nonbilious emesis since 6 AM, attempted to drink ginger ale and take Zofran ODT which did not help. She's had 3 episodes of nonbloody watery diarrhea since onset. After vomiting, she reports that she developed some abdominal soreness in the epigastric area that she describes as 7/10 constant sore nonradiating epigastric pain that worsens with vomiting and with no specific treatments tried prior to arrival. Additionally she states that she feels some aching in her left flank that she attributes to "feeling dehydrated".  She denies fevers, chills, CP, SOB, constipation, obstipation, melena, hematochezia, hematemesis, hematuria, dysuria, vaginal bleeding/discharge, myalgias, arthralgias, numbness, tingling, focal weakness, or any other complaints at this time. Denies recent travel, suspicious food intake, or NSAID use. LMP 03/01/17. +EtOH use occasionally, last consumption was on Wednesday (2 days ago) when she had 2 martini's.    The history is provided by the patient and medical records. No language interpreter was used.  Emesis   This is a new problem. The current episode started 3 to 5 hours ago. The problem occurs 5 to 10 times per day. The problem has not changed since onset.The emesis has an appearance of stomach contents. There has been no fever. Associated symptoms include  abdominal pain and diarrhea. Pertinent negatives include no arthralgias, no chills, no fever and no myalgias. Risk factors include ill contacts.    Past Medical History:  Diagnosis Date  . Abnormal LFTs    being evaluated for this  . Asthma    exercise-induced  . Kidney stones   . Migraine    monthly with menses  . PONV (postoperative nausea and vomiting)     Patient Active Problem List   Diagnosis Date Noted  . Preventative health care 03/09/2016  . Nephrolithiasis 01/01/2016  . Hx of migraines 01/01/2016  . Exercise-induced asthma 01/01/2016    Past Surgical History:  Procedure Laterality Date  . CHOLECYSTECTOMY  2003   laparoscopic  . EUS N/A 05/01/2016   Procedure: UPPER ENDOSCOPIC ULTRASOUND (EUS) LINEAR;  Surgeon: Rachael Fee, MD;  Location: [facility];  Service: Endoscopy;  Laterality: N/A;  . KIDNEY STONE SURGERY  2009  . LITHOTRIPSY  12/2015  . WISDOM TOOTH EXTRACTION  10/2002    OB History    Gravida Para Term Preterm AB Living   0 0 2   SAB TAB Ectopic Multiple Live Births   0 0 0 0 2       Home Medications    Prior to Admission medications   Medication Sig Start Date End Date Taking? Authorizing Provider  Minocycline HCl 90 MG TB24 Take 1 tablet by mouth daily. 02/12/17   Historical Provider, MD    Family History Family History  Problem Relation Age of Onset  . Cancer Paternal Grandfather     liver  . Alcohol abuse Paternal Grandfather   .  Hyperlipidemia Mother   . Diabetes Father   . Hypertension Father   . Alcohol abuse Father   . Frontotemporal dementia Father     tentative (still undergoing work up -- 03/2017)  . Alzheimer's disease Maternal Grandmother   . Arthritis Maternal Grandmother   . Parkinson's disease Maternal Grandfather   . Stroke Paternal Grandmother   . Heart disease Paternal Grandmother   . Other Neg Hx     Social History Social History  Substance Use Topics  . Smoking status: Never Smoker  . Smokeless  tobacco: Never Used  . Alcohol use 0.0 oz/week     Comment: occasional     Allergies   Hydrocodone   Review of Systems Review of Systems  Constitutional: Negative for chills and fever.  Respiratory: Negative for shortness of breath.   Cardiovascular: Negative for chest pain.  Gastrointestinal: Positive for abdominal pain, diarrhea, nausea and vomiting. Negative for blood in stool and constipation.  Genitourinary: Positive for flank pain ("feels dehydrated"). Negative for dysuria, hematuria, vaginal bleeding and vaginal discharge.  Musculoskeletal: Negative for arthralgias and myalgias.  Skin: Negative for color change.  Allergic/Immunologic: Negative for immunocompromised state.  Neurological: Negative for weakness and numbness.  Psychiatric/Behavioral: Negative for confusion.   10 Systems reviewed and are negative for acute change except as noted in the HPI.   Physical Exam Updated Vital Signs BP 104/72 (BP Location: Right Arm)   Pulse 87   Temp 97.7 F (36.5 C) (Oral)   Ht  (1.626 m)   Wt 46.3 kg   LMP 03/01/2017   SpO2 97%   BMI 17.51 kg/m   Physical Exam  Constitutional: She is oriented to person, place, and time. Vital signs are normal. She appears well-developed and well-nourished.  Non-toxic appearance. No distress.  Afebrile, nontoxic, NAD although holding emesis bag close, not feeling well  HENT:  Head: Normocephalic and atraumatic.  Mouth/Throat: Oropharynx is clear and moist and mucous membranes are normal.  Eyes: Conjunctivae and EOM are normal. Right eye exhibits no discharge. Left eye exhibits no discharge.  Neck: Normal range of motion. Neck supple.  Cardiovascular: Normal rate, regular rhythm, normal heart sounds and intact distal pulses.  Exam reveals no gallop and no friction rub.   No murmur heard. Pulmonary/Chest: Effort normal and breath sounds normal. No respiratory distress. She has no decreased breath sounds. She has no wheezes. She has no  rhonchi. She has no rales.  Abdominal: Soft. Normal appearance and bowel sounds are normal. She exhibits no distension. There is tenderness in the epigastric area. There is no rigidity, no rebound, no guarding, no CVA tenderness, no tenderness at McBurney's point and negative Murphy's sign.    Soft, nondistended, +BS throughout, with mild epigastric TTP, no r/g/r, neg murphy's, neg mcburney's, no CVA TTP   Musculoskeletal: Normal range of motion.  Neurological: She is alert and oriented to person, place, and time. She has normal strength. No sensory deficit.  Skin: Skin is warm, dry and intact. No rash noted.  Psychiatric: She has a normal mood and affect.  Nursing note and vitals reviewed.    ED Treatments / Results  Labs (all labs ordered are listed, but only abnormal results are displayed) Labs Reviewed  CBC WITH DIFFERENTIAL/PLATELET - Abnormal; Notable for the following:       Result Value   WBC 16.2 (*)    Neutro Abs 14.2 (*)    All other components within normal limits  COMPREHENSIVE METABOLIC PANEL - Abnormal; Notable  for the following:    Glucose, Bld 103 (*)    ALT 13 (*)    All other components within normal limits  PREGNANCY, URINE  URINALYSIS, ROUTINE W REFLEX MICROSCOPIC  LIPASE, BLOOD    EKG  EKG Interpretation None       Radiology No results found.  Procedures Procedures (including critical care time)  Medications Ordered in ED Medications  ondansetron (ZOFRAN) injection 4 mg (not administered)  sodium chloride 0.9 % bolus 2,000 mL (2,000 mLs Intravenous New Bag/Given 03/13/17 0934)  promethazine (PHENERGAN) injection 25 mg (25 mg Intravenous Given 03/13/17 0934)  ketorolac (TORADOL) 30 MG/ML injection 15 mg (15 mg Intravenous Given 03/13/17 0940)     Initial Impression / Assessment and Plan / ED Course  I have reviewed the triage vital signs and the nursing notes.  Pertinent labs & imaging results that were available during my care of the patient  were reviewed by me and considered in my medical decision making (see chart for details).     31 y.o. female here with n/v/d x3hrs, +sick contacts at home, husband with exact same illness last night. Took zofran without relief. Some abd soreness from vomiting. On exam, mild epigastric TTP, nonperitoneal. Will get labs, doubt need for imaging. Will give fluids, phenergan, toradol, and reassess shortly.  10:51 AM CBC w/diff with mild leukocytosis likely from stress demargination of vomiting. CMP WNL. Lipase WNL. U/A unremarkable. Upreg neg. Pt feeling much better, nausea and pain have subsided. Will PO challenge. As long as she does well, will likely d/c home with phenergan rx, advised BRAT diet and OTC remedies for diarrhea/symptom control. Will reassess shortly.   12:45 PM Tolerating PO well, starting to feel a little nauseated again but states she thinks it's because she hasn't eaten; will give zofran now before she leaves, and then d/c with previously outlined plan. F/up with PCP in 5-7 days for recheck. I explained the diagnosis and have given explicit precautions to return to the ER including for any other new or worsening symptoms. The patient understands and accepts the medical plan as it's been dictated and I have answered their questions. Discharge instructions concerning home care and prescriptions have been given. The patient is STABLE and is discharged to home in good condition.    Final Clinical Impressions(s) / ED Diagnoses   Final diagnoses:  Nausea vomiting and diarrhea  Viral gastroenteritis  Epigastric abdominal pain  Other elevated white blood cell (WBC) count    New Prescriptions New Prescriptions   PROMETHAZINE (PHENERGAN) 25 MG TABLET    Take 1 tablet (25 mg total) by mouth every 6 (six) hours as needed for nausea or vomiting.     [address], PA-C 03/13/17 1245    Alvira Monday, MD 03/17/17 1642

## 2017-06-05 ENCOUNTER — Encounter: Payer: Self-pay | Admitting: Family Medicine

## 2017-06-05 ENCOUNTER — Ambulatory Visit (INDEPENDENT_AMBULATORY_CARE_PROVIDER_SITE_OTHER): Payer: Managed Care, Other (non HMO) | Admitting: Family Medicine

## 2017-06-05 VITALS — BP 102/60 | HR 102 | Temp 98.4°F | Ht 64.0 in | Wt 105.2 lb

## 2017-06-05 DIAGNOSIS — J029 Acute pharyngitis, unspecified: Secondary | ICD-10-CM

## 2017-06-05 LAB — POCT RAPID STREP A (OFFICE): Rapid Strep A Screen: NEGATIVE

## 2017-06-05 NOTE — Progress Notes (Signed)
SUBJECTIVE:   Carrie Combs is a 31 y.o. female presents to the clinic for:  Chief Complaint  Patient presents with  . Sore Throat    started this morning  . Fatigue    Complains of sore throat for 1 day.  Other associated symptoms: sinus congestion, rhinorrhea and ear fullness.  Denies: sinus pain, itchy watery eyes, ear drainage, shortness of breath and fevers Sick Contacts: daughter and husband Therapy to date: [facility]  History  Smoking Status  . Never Smoker  Smokeless Tobacco  . Never Used    ROS: Pertinent items are noted in HPI  Patient's medications, allergies, past medical, surgical, social and family histories were reviewed and updated as appropriate.  OBJECTIVE:  BP 102/60 (BP Location: Left Arm, Patient Position: Sitting, Cuff Size: Normal)   Pulse (!) 102   Temp 98.4 F (36.9 C) (Oral)   Ht 5\' 4"  (1.626 m)   Wt 105 lb 4 oz (47.7 kg)   SpO2 97%   BMI 18.07 kg/m  General: Awake, alert, appearing stated age Eyes: conjunctivae and sclerae clear Ears: normal TM on R, TM on L mildly retracted. Nose: no visible exudate Oropharynx: Tonsillith on L, tonsils are pink, 1's b/l, PND appreciated Neck: supple, no significant adenopathy, no TTP Lungs: clear to auscultation, no wheezes, rales or rhonchi, symmetric air entry, normal effort Heart: rate and rhythm regular Skin:reveals no rash Psych: Age appropriate judgment and insight  ASSESSMENT/PLAN:  Sore throat - Plan: POCT rapid strep A  Rapid strep neg. 1/4 Centor criteria, likely viral given hx of sick contacts and her own s/s's. Continue to practice good hand hygiene and push fluids. Ibuprofen and acetaminophen for pain. Salt water gargles, lozenges if helpful.  F/u prn. Pt voiced understanding and agreement to the plan.  Jilda Rocheicholas Paul CotterWendling, DO 06/05/17 4:44 PM

## 2017-06-05 NOTE — Progress Notes (Signed)
Pre visit review using our clinic review tool, if applicable. No additional management support is needed unless otherwise documented below in the visit note. 

## 2017-06-05 NOTE — Patient Instructions (Signed)
Ibuprofen 400-600 mg (2-3 over the counter strength tabs) every 6 hours as needed for pain.  OK to take Tylenol 1000 mg (2 extra strength tabs) or 975 mg (3 regular strength tabs) every 6 hours as needed.  Salt water gargles may be helpful. Same with throat lozenges.  Let us know if you need anything.

## 2017-06-11 ENCOUNTER — Telehealth: Payer: Self-pay | Admitting: Family

## 2017-06-11 NOTE — Telephone Encounter (Signed)
Relation to RU:EAVWpt:self Call back number:[phone]   Reason for call:  Patient was last seen 06/05/17 and states symptoms have not improved, requesting Antibiotics

## 2017-06-12 MED ORDER — AMOXICILLIN 875 MG PO TABS: 875.0000 mg | ORAL_TABLET | Freq: Two times a day (BID) | ORAL | 0 refills | Status: DC

## 2017-06-12 NOTE — Telephone Encounter (Signed)
Pt says that she was seen by provider for a sore throat. She said that she is certain that she have a sinus infection. She would like to know if provider will prescribed her something for a sinus infection.   Pharmacy: [facility]

## 2017-06-12 NOTE — Telephone Encounter (Signed)
No mention of antibiotics found on the note from a few days ago however patient has upper respiratory symptoms lasting for several days. Plan: Amoxicillin 875 mg 1 by mouth twice a day #14 no refills. Flonase 2 sprays on the side of the nose daily until better. Tylenol when necessary Office visit if symptoms not improving or if worse. Call the patient, make her aware of above. Call a prescription for amoxicillin

## 2017-06-12 NOTE — Telephone Encounter (Signed)
Called and spoke with the pt and she stated that Dr. Carmelia RollerWendling asked her to give him a call back if she starts to runs a fever or she gets worse.  She stated that she getting worse.   She has a headache,ears feel clogged, and her teeth hurt.  She stated that she do not want to go through the weekend feeling bad.  Please advise.//AB/CMA

## 2017-06-12 NOTE — Telephone Encounter (Signed)
Called and spoke with the pt and informed her of the message below.  Pt verbalized understanding and agreed.   New prescription sent to the pharmacy by e-script.//AB/CMA

## 2017-06-18 ENCOUNTER — Telehealth: Payer: Self-pay | Admitting: Family

## 2017-06-18 NOTE — Telephone Encounter (Signed)
OR : [phone] pt says that her phone isn't working properly.

## 2017-06-18 NOTE — Telephone Encounter (Signed)
Pt called to follow up with provider. She said that she was seen for sinuses. Pt says that medication isn't working. She would like to be advised further.      CB: [phone]

## 2017-06-19 NOTE — Telephone Encounter (Signed)
Patient returning call.

## 2017-06-19 NOTE — Telephone Encounter (Signed)
[facility] @ 8:16am @ [phone]) asking the pt to RTC regarding the message below.//AB/CMA

## 2017-06-19 NOTE — Telephone Encounter (Signed)
I thought this was viral and did not rx antibiotics. Before rx'ing anything different, she should be seen if she is not getting better. TY.

## 2017-06-19 NOTE — Telephone Encounter (Signed)
Called and spoke with the pt and informed her of the message below.  Pt verbalized understanding and stated that she will when she comes back.  Asked the pt if she would like to come in today to be seen and she stated that she is out of town and she wanted to see if she could get something where she was out of town.  Informed her that the provider will need to recheck her.  Pt verbalized understanding and agreed.//AB/CMA

## 2017-08-21 ENCOUNTER — Ambulatory Visit (INDEPENDENT_AMBULATORY_CARE_PROVIDER_SITE_OTHER): Payer: Managed Care, Other (non HMO) | Admitting: Family Medicine

## 2017-08-21 ENCOUNTER — Encounter: Payer: Self-pay | Admitting: Family Medicine

## 2017-08-21 VITALS — BP 104/69 | HR 99 | Temp 98.7°F | Resp 20 | Wt 102.5 lb

## 2017-08-21 DIAGNOSIS — R0789 Other chest pain: Secondary | ICD-10-CM | POA: Diagnosis not present

## 2017-08-21 DIAGNOSIS — R6889 Other general symptoms and signs: Secondary | ICD-10-CM

## 2017-08-21 DIAGNOSIS — J45990 Exercise induced bronchospasm: Secondary | ICD-10-CM

## 2017-08-21 DIAGNOSIS — J4521 Mild intermittent asthma with (acute) exacerbation: Secondary | ICD-10-CM | POA: Diagnosis not present

## 2017-08-21 LAB — POC INFLUENZA A&B (BINAX/QUICKVUE)
INFLUENZA A, POC: NEGATIVE
Influenza B, POC: NEGATIVE

## 2017-08-21 MED ORDER — ALBUTEROL SULFATE HFA 108 (90 BASE) MCG/ACT IN AERS: 2.0000 | INHALATION_SPRAY | Freq: Four times a day (QID) | RESPIRATORY_TRACT | 0 refills | Status: DC | PRN

## 2017-08-21 MED ORDER — PREDNISONE 20 MG PO TABS: 40.0000 mg | ORAL_TABLET | Freq: Every day | ORAL | 0 refills | Status: DC

## 2017-08-21 MED ORDER — IPRATROPIUM-ALBUTEROL 0.5-2.5 (3) MG/3ML IN SOLN
3.0000 mL | Freq: Once | RESPIRATORY_TRACT | Status: AC
  Administered 2017-08-21: 3 mL via RESPIRATORY_TRACT

## 2017-08-21 NOTE — Progress Notes (Signed)
Carrie Combs , August 21, 1986, 31 y.o., female MRN: [id number] Patient Care Team    Relationship Specialty Notifications Start End  Sandford Craze, NP PCP - General Internal Medicine  12/31/15   Essie Hart, MD Consulting Physician Obstetrics and Gynecology  12/31/15   Hildred Laser, MD Consulting Physician Urology  03/06/16   Hilarie Fredrickson, MD Consulting Physician Gastroenterology  03/06/16   Ether Griffins, MD Consulting Physician Dentistry  03/06/16     Chief Complaint  Patient presents with  . Fever    chills,pain with inspiration right side,headache     Subjective: Pt presents for an OV with complaints of chest pain with deep inspiraton of 3 days duration.  Associated symptoms include chills, low grade fever of 100F, joint aches, mild sore throat, headache, cough and fatigue. She states her children are in daycare and one was sick last week. She reports initially she thought it was a reaction to a gluten containing soup (she is gluten free). She felt "gassy" after eating it and thought that what was causing her chest discomfort. She was miserable the days she ate it, but felt perfectly fine the next day. Yesterday she started feeling very fatigued and achy. She has a h/o exercise induced asthma and feels her chest is "heavy". The chest discomfort is intermittent and varies from left to right side of her chest/lung fields. Pt has tried nothing to ease their symptoms.   Depression screen [facility] 2/9 03/11/2017  Decreased Interest 0  Down, Depressed, Hopeless 0  PHQ - 2 Score 0  Altered sleeping 0  Tired, decreased energy 1  Change in appetite 0  Feeling bad or failure about yourself  0  Trouble concentrating 0  Moving slowly or fidgety/restless 0  Suicidal thoughts 0  PHQ-9 Score 1    Allergies  Allergen Reactions  . Hydrocodone Nausea And Vomiting   Social History  Substance Use Topics  . Smoking status: Never Smoker  . Smokeless tobacco: Never Used  . Alcohol use 0.0  oz/week     Comment: occasional   Past Medical History:  Diagnosis Date  . Abnormal LFTs    being evaluated for this  . Asthma    exercise-induced  . Kidney stones   . Migraine    monthly with menses  . PONV (postoperative nausea and vomiting)    Past Surgical History:  Procedure Laterality Date  . CHOLECYSTECTOMY  2003   laparoscopic  . EUS N/A 05/01/2016   Procedure: UPPER ENDOSCOPIC ULTRASOUND (EUS) LINEAR;  Surgeon: Rachael Fee, MD;  Location: [facility];  Service: Endoscopy;  Laterality: N/A;  . KIDNEY STONE SURGERY  2009  . LITHOTRIPSY  12/2015  . WISDOM TOOTH EXTRACTION  10/2002   Family History  Problem Relation Age of Onset  . Cancer Paternal Grandfather        liver  . Alcohol abuse Paternal Grandfather   . Hyperlipidemia Mother   . Diabetes Father   . Hypertension Father   . Alcohol abuse Father   . Frontotemporal dementia Father        tentative (still undergoing work up -- 03/2017)  . Alzheimer's disease Maternal Grandmother   . Arthritis Maternal Grandmother   . Parkinson's disease Maternal Grandfather   . Stroke Paternal Grandmother   . Heart disease Paternal Grandmother   . Other Neg Hx    Allergies as of 08/21/2017      Reactions   Hydrocodone Nausea And Vomiting  Medication List       Accurate as of 08/21/17 10:03 AM. Always use your most recent med list.          albuterol 108 (90 Base) MCG/ACT inhaler Commonly known as:  PROVENTIL HFA;VENTOLIN HFA Inhale 2 puffs into the lungs every 6 (six) hours as needed for wheezing or shortness of breath.   ISOtretinoin 30 MG capsule Commonly known as:  ACCUTANE Take 30 mg by mouth daily.   predniSONE 20 MG tablet Commonly known as:  DELTASONE Take 2 tablets (40 mg total) by mouth daily with breakfast.            Discharge Care Instructions        Start     Ordered   08/21/17 0900  IPRATROPIUM-ALBUTEROL 0.5-2.5 (3) MG/3ML IN SOLN   Once     08/21/17 0853   08/21/17 0000  POC  Influenza A&B (Binax test)     08/21/17 0842   08/21/17 0000  predniSONE (DELTASONE) 20 MG tablet  Daily with breakfast     08/21/17 0859   08/21/17 0000  albuterol (PROVENTIL HFA;VENTOLIN HFA) 108 (90 Base) MCG/ACT inhaler  Every 6 hours PRN     08/21/17 0859      All past medical history, surgical history, allergies, family history, immunizations andmedications were updated in the EMR today and reviewed under the history and medication portions of their EMR.     ROS: Negative, with the exception of above mentioned in HPI   Objective:  BP 104/69 (BP Location: Right Arm, Patient Position: Sitting, Cuff Size: Normal)   Pulse 99   Temp 98.7 F (37.1 C)   Resp 20   Wt 102 lb 8 oz (46.5 kg)   SpO2 99%   BMI 17.59 kg/m  Body mass index is 17.59 kg/m. Gen: Afebrile. No acute distress. Nontoxic in appearance, well developed, well nourished.  HENT: AT. [place]. Bilateral TM visualized with fullness bilateral, no erythema.  MMM, no oral lesions. Bilateral nares with erythema and drainage. Throat without erythema or exudates. Cough present, no hoarsness or TTP sinus. Eyes:Pupils Equal Round Reactive to light, Extraocular movements intact,  Conjunctiva without redness, discharge or icterus. Neck/lymp/endocrine: Supple,No lymphadenopathy CV: RRR  Chest: CTAB, no wheeze or crackles. Good air movement, normal resp effort. Cough with deep inspiration.  Abd: Soft. NTND. BS present.  Skin: no rashes, purpura or petechiae.    No exam data present No results found. Results for orders placed or performed in visit on 08/21/17 (from the past 24 hour(s))  POC Influenza A&B (Binax test)     Status: Normal   Collection Time: 08/21/17  9:53 AM  Result Value Ref Range   Influenza A, POC Negative Negative   Influenza B, POC Negative Negative    Assessment/Plan: Carrie Combs is a 31 y.o. female present for OV for  Flu-like symptoms Viral syndrome Chest tightness H/o Exercise-induced asthma - Viral  upper resp. Infection discussed. Supportive OTC therapy recommended. Rest and hydrate. Chest "tightness" and cough resolved with albuterol treatment in the office.  - scripts: albuterol inhaler, 1-2 puffs q 4-6 hours PRN while sick for chest tightness. Prednisone burst x 5 days - POC Influenza A&B (Binax test)--> flu negative - ipratropium-albuterol (DUONEB) 0.5-2.5 (3) MG/3ML nebulizer solution 3 mL; Take 3 mLs by nebulization once. - predniSONE (DELTASONE) 20 MG tablet; Take 2 tablets (40 mg total) by mouth daily with breakfast.  Dispense: 10 tablet; Refill: 0 - albuterol (PROVENTIL HFA;VENTOLIN HFA) 108 (90  Base) MCG/ACT inhaler; Inhale 2 puffs into the lungs every 6 (six) hours as needed for wheezing or shortness of breath.  Dispense: 1 Inhaler; Refill: 0 - F/U 1-2 weeks if no improvement.    Reviewed expectations re: course of current medical issues.  Discussed self-management of symptoms.  Outlined signs and symptoms indicating need for more acute intervention.  Patient verbalized understanding and all questions were answered.  Patient received an After-Visit Summary.    Orders Placed This Encounter  Procedures  . POC Influenza A&B (Binax test)     Note is dictated utilizing voice recognition software. Although note has been proof read prior to signing, occasional typographical errors still can be missed. If any questions arise, please do not hesitate to call for verification.   electronically signed by:  Felix Pacini, DO  [facility] [place]

## 2017-08-21 NOTE — Patient Instructions (Signed)
It was a pleasure meeting you today.  Your flu test is negative. This is likely a viral illness and needs to run its course.  I have prescribed an Albuterol inhaler for you to use for your chest tightness. You can use this during illness 1-2 puffs every 6 hours, as needed only during your illness and a steroid burst for 5 days.   Take over the counter pain relief for discomfort/fever.  Stay hydrated! Water and G2 (gatorade) will help balance electrolytes.   Viral Illness, Adult Viruses are tiny germs that can get into a person's body and cause illness. There are many different types of viruses, and they cause many types of illness. Viral illnesses can range from mild to severe. They can affect various parts of the body. Common illnesses that are caused by a virus include colds and the flu. Viral illnesses also include serious conditions such as HIV/AIDS (human immunodeficiency virus/acquired immunodeficiency syndrome). A few viruses have been linked to certain cancers. What are the causes? Many types of viruses can cause illness. Viruses invade cells in your body, multiply, and cause the infected cells to malfunction or die. When the cell dies, it releases more of the virus. When this happens, you develop symptoms of the illness, and the virus continues to spread to other cells. If the virus takes over the function of the cell, it can cause the cell to divide and grow out of control, as is the case when a virus causes cancer. Different viruses get into the body in different ways. You can get a virus by:  Swallowing food or water that is contaminated with the virus.  Breathing in droplets that have been coughed or sneezed into the air by an infected person.  Touching a surface that has been contaminated with the virus and then touching your eyes, nose, or mouth.  Being bitten by an insect or animal that carries the virus.  Having sexual contact with a person who is infected with the  virus.  Being exposed to blood or fluids that contain the virus, either through an open cut or during a transfusion.  If a virus enters your body, your body's defense system (immune system) will try to fight the virus. You may be at higher risk for a viral illness if your immune system is weak. What are the signs or symptoms? Symptoms vary depending on the type of virus and the location of the cells that it invades. Common symptoms of the main types of viral illnesses include: Cold and flu viruses  Fever.  Headache.  Sore throat.  Muscle aches.  Nasal congestion.  Cough. Digestive system (gastrointestinal) viruses  Fever.  Abdominal pain.  Nausea.  Diarrhea. Liver viruses (hepatitis)  Loss of appetite.  Tiredness.  Yellowing of the skin (jaundice). Brain and spinal cord viruses  Fever.  Headache.  Stiff neck.  Nausea and vomiting.  Confusion or sleepiness. Skin viruses  Warts.  Itching.  Rash. Sexually transmitted viruses  Discharge.  Swelling.  Redness.  Rash. How is this treated? Viruses can be difficult to treat because they live within cells. Antibiotic medicines do not treat viruses because these drugs do not get inside cells. Treatment for a viral illness may include:  Resting and drinking plenty of fluids.  Medicines to relieve symptoms. These can include over-the-counter medicine for pain and fever, medicines for cough or congestion, and medicines to relieve diarrhea.  Antiviral medicines. These drugs are available only for certain types of viruses. They may  help reduce flu symptoms if taken early. There are also many antiviral medicines for hepatitis and HIV/AIDS.  Some viral illnesses can be prevented with vaccinations. A common example is the flu shot. Follow these instructions at home: Medicines   Take over-the-counter and prescription medicines only as told by your health care provider.  If you were prescribed an antiviral  medicine, take it as told by your health care provider. Do not stop taking the medicine even if you start to feel better.  Be aware of when antibiotics are needed and when they are not needed. Antibiotics do not treat viruses. If your health care provider thinks that you may have a bacterial infection as well as a viral infection, you may get an antibiotic. ? Do not ask for an antibiotic prescription if you have been diagnosed with a viral illness. That will not make your illness go away faster. ? Frequently taking antibiotics when they are not needed can lead to antibiotic resistance. When this develops, the medicine no longer works against the bacteria that it normally fights. General instructions  Drink enough fluids to keep your urine clear or pale yellow.  Rest as much as possible.  Return to your normal activities as told by your health care provider. Ask your health care provider what activities are safe for you.  Keep all follow-up visits as told by your health care provider. This is important. How is this prevented? Take these actions to reduce your risk of viral infection:  Eat a healthy diet and get enough rest.  Wash your hands often with soap and water. This is especially important when you are in public places. If soap and water are not available, use hand sanitizer.  Avoid close contact with friends and family who have a viral illness.  If you travel to areas where viral gastrointestinal infection is common, avoid drinking water or eating raw food.  Keep your immunizations up to date. Get a flu shot every year as told by your health care provider.  Do not share toothbrushes, nail clippers, razors, or needles with other people.  Always practice safe sex.  Contact a health care provider if:  You have symptoms of a viral illness that do not go away.  Your symptoms come back after going away.  Your symptoms get worse. Get help right away if:  You have trouble  breathing.  You have a severe headache or a stiff neck.  You have severe vomiting or abdominal pain. This information is not intended to replace advice given to you by your health care provider. Make sure you discuss any questions you have with your health care provider. Document Released: 03/28/2016 Document Revised: 04/30/2016 Document Reviewed: 03/28/2016 Elsevier Interactive Patient Education  [employer].

## 2017-12-01 DIAGNOSIS — R945 Abnormal results of liver function studies: Secondary | ICD-10-CM

## 2017-12-01 DIAGNOSIS — R7989 Other specified abnormal findings of blood chemistry: Secondary | ICD-10-CM

## 2017-12-01 HISTORY — DX: Other specified abnormal findings of blood chemistry: R79.89

## 2017-12-01 HISTORY — DX: Abnormal results of liver function studies: R94.5

## 2018-01-07 ENCOUNTER — Other Ambulatory Visit: Payer: Self-pay

## 2018-01-07 ENCOUNTER — Encounter (HOSPITAL_BASED_OUTPATIENT_CLINIC_OR_DEPARTMENT_OTHER): Payer: Self-pay | Admitting: Emergency Medicine

## 2018-01-07 ENCOUNTER — Emergency Department (HOSPITAL_BASED_OUTPATIENT_CLINIC_OR_DEPARTMENT_OTHER)
Admission: EM | Admit: 2018-01-07 | Discharge: 2018-01-07 | Disposition: A | Discharge status: Home / Self Care | Payer: Managed Care, Other (non HMO) | Attending: Emergency Medicine | Admitting: Emergency Medicine

## 2018-01-07 DIAGNOSIS — Z5321 Procedure and treatment not carried out due to patient leaving prior to being seen by health care provider: Secondary | ICD-10-CM | POA: Insufficient documentation

## 2018-01-07 DIAGNOSIS — R109 Unspecified abdominal pain: Secondary | ICD-10-CM | POA: Insufficient documentation

## 2018-01-07 LAB — URINALYSIS, ROUTINE W REFLEX MICROSCOPIC
Bilirubin Urine: NEGATIVE
GLUCOSE, UA: NEGATIVE mg/dL
HGB URINE DIPSTICK: NEGATIVE
Ketones, ur: 15 mg/dL — AB
LEUKOCYTES UA: NEGATIVE
Nitrite: NEGATIVE
Protein, ur: NEGATIVE mg/dL
SPECIFIC GRAVITY, URINE: 1.01 (ref 1.005–1.030)
pH: 6 (ref 5.0–8.0)

## 2018-01-07 LAB — PREGNANCY, URINE: Preg Test, Ur: NEGATIVE

## 2018-01-07 NOTE — ED Triage Notes (Signed)
Patient states that she has had a pain to her left flank to mid back since this am. The patient reports that she has take some oxycodone ( 2.5 ) x 2 - The patient reports that she is nauseated. REports that she has had kidney stones in the past and feels like this is one again

## 2018-01-29 ENCOUNTER — Encounter (HOSPITAL_BASED_OUTPATIENT_CLINIC_OR_DEPARTMENT_OTHER): Payer: Self-pay

## 2018-01-29 ENCOUNTER — Emergency Department (HOSPITAL_BASED_OUTPATIENT_CLINIC_OR_DEPARTMENT_OTHER)
Admission: EM | Admit: 2018-01-29 | Discharge: 2018-01-29 | Disposition: A | Discharge status: Home / Self Care | Payer: Managed Care, Other (non HMO) | Attending: Emergency Medicine | Admitting: Emergency Medicine

## 2018-01-29 ENCOUNTER — Other Ambulatory Visit: Payer: Self-pay

## 2018-01-29 DIAGNOSIS — Z79899 Other long term (current) drug therapy: Secondary | ICD-10-CM | POA: Insufficient documentation

## 2018-01-29 DIAGNOSIS — R112 Nausea with vomiting, unspecified: Secondary | ICD-10-CM | POA: Insufficient documentation

## 2018-01-29 DIAGNOSIS — Z9049 Acquired absence of other specified parts of digestive tract: Secondary | ICD-10-CM | POA: Insufficient documentation

## 2018-01-29 DIAGNOSIS — R1013 Epigastric pain: Secondary | ICD-10-CM | POA: Diagnosis not present

## 2018-01-29 DIAGNOSIS — R197 Diarrhea, unspecified: Secondary | ICD-10-CM | POA: Insufficient documentation

## 2018-01-29 DIAGNOSIS — R109 Unspecified abdominal pain: Secondary | ICD-10-CM | POA: Diagnosis present

## 2018-01-29 DIAGNOSIS — J45909 Unspecified asthma, uncomplicated: Secondary | ICD-10-CM | POA: Diagnosis not present

## 2018-01-29 LAB — CBC WITH DIFFERENTIAL/PLATELET
BASOS ABS: 0 10*3/uL (ref 0.0–0.1)
Basophils Relative: 0 %
EOS ABS: 0 10*3/uL (ref 0.0–0.7)
Eosinophils Relative: 0 %
HCT: 40 % (ref 36.0–46.0)
Hemoglobin: 13.3 g/dL (ref 12.0–15.0)
Lymphocytes Relative: 8 %
Lymphs Abs: 0.6 10*3/uL — ABNORMAL LOW (ref 0.7–4.0)
MCH: 30.4 pg (ref 26.0–34.0)
MCHC: 33.3 g/dL (ref 30.0–36.0)
MCV: 91.3 fL (ref 78.0–100.0)
MONO ABS: 0.6 10*3/uL (ref 0.1–1.0)
Monocytes Relative: 8 %
Neutro Abs: 6.3 10*3/uL (ref 1.7–7.7)
Neutrophils Relative %: 84 %
PLATELETS: 227 10*3/uL (ref 150–400)
RBC: 4.38 MIL/uL (ref 3.87–5.11)
RDW: 12.1 % (ref 11.5–15.5)
WBC: 7.4 10*3/uL (ref 4.0–10.5)

## 2018-01-29 LAB — COMPREHENSIVE METABOLIC PANEL
ALT: 98 U/L — ABNORMAL HIGH (ref 14–54)
AST: 212 U/L — AB (ref 15–41)
Albumin: 4.2 g/dL (ref 3.5–5.0)
Alkaline Phosphatase: 77 U/L (ref 38–126)
Anion gap: 9 (ref 5–15)
BUN: 11 mg/dL (ref 6–20)
CHLORIDE: 103 mmol/L (ref 101–111)
CO2: 25 mmol/L (ref 22–32)
Calcium: 9 mg/dL (ref 8.9–10.3)
Creatinine, Ser: 0.56 mg/dL (ref 0.44–1.00)
GFR calc Af Amer: >60 mL/min (ref >60)
GFR calc non Af Amer: >60 mL/min (ref >60)
GLUCOSE: 127 mg/dL — AB (ref 65–99)
POTASSIUM: 3.7 mmol/L (ref 3.5–5.1)
Sodium: 137 mmol/L (ref 135–145)
Total Bilirubin: 1 mg/dL (ref 0.3–1.2)
Total Protein: 7 g/dL (ref 6.5–8.1)

## 2018-01-29 LAB — URINALYSIS, ROUTINE W REFLEX MICROSCOPIC
Bilirubin Urine: NEGATIVE
Glucose, UA: NEGATIVE mg/dL
Hgb urine dipstick: NEGATIVE
Ketones, ur: 15 mg/dL — AB
LEUKOCYTES UA: NEGATIVE
Nitrite: NEGATIVE
PROTEIN: NEGATIVE mg/dL
Specific Gravity, Urine: 1.01 (ref 1.005–1.030)
pH: 8.5 — ABNORMAL HIGH (ref 5.0–8.0)

## 2018-01-29 LAB — PREGNANCY, URINE: PREG TEST UR: NEGATIVE

## 2018-01-29 LAB — LIPASE, BLOOD: LIPASE: 28 U/L (ref 11–51)

## 2018-01-29 MED ORDER — SODIUM CHLORIDE 0.9 % IV BOLUS (SEPSIS)
1000.0000 mL | Freq: Once | INTRAVENOUS | Status: AC
  Administered 2018-01-29: 1000 mL via INTRAVENOUS

## 2018-01-29 MED ORDER — KETOROLAC TROMETHAMINE 15 MG/ML IJ SOLN
15.0000 mg | Freq: Once | INTRAMUSCULAR | Status: AC
  Administered 2018-01-29: 15 mg via INTRAVENOUS
  Filled 2018-01-29: qty 1

## 2018-01-29 MED ORDER — ONDANSETRON HCL 4 MG/2ML IJ SOLN
4.0000 mg | Freq: Once | INTRAMUSCULAR | Status: AC | PRN
  Administered 2018-01-29: 4 mg via INTRAVENOUS

## 2018-01-29 MED ORDER — ONDANSETRON HCL 4 MG/2ML IJ SOLN
INTRAMUSCULAR | Status: AC
  Filled 2018-01-29: qty 2

## 2018-01-29 NOTE — ED Provider Notes (Signed)
[facility] Provider Note: Carrie Combs Madoc Holquin, MD, FACEP  CSN: [id number] MRN: [id number] ARRIVAL: 01/29/18 at 0014 ROOM: [facility]   CHIEF COMPLAINT  Abdominal Pain   HISTORY OF PRESENT ILLNESS  01/29/18 1:41 AM Carrie Combs is a 32 y.o. female with a long-standing history of episodic abdominal pain with nausea and vomiting.  These episodes occur about once every 9 months.  She is here with epigastric pain that began yesterday around noon.  The pain radiates into her lower back.  She rated it as an 8 out of 10 on arrival but it has since improved.  It is been associated with nausea and vomiting since about an hour prior to arrival but not diarrhea.  She took Phenergan at home without relief.  She denies marijuana use but has been taking CBD oil for the past 5 days.    Past Medical History:  Diagnosis Date  . Abnormal LFTs    being evaluated for this  . Asthma    exercise-induced  . Kidney stones   . Migraine    monthly with menses  . PONV (postoperative nausea and vomiting)     Past Surgical History:  Procedure Laterality Date  . CHOLECYSTECTOMY  2003   laparoscopic  . EUS N/A 05/01/2016   Procedure: UPPER ENDOSCOPIC ULTRASOUND (EUS) LINEAR;  Surgeon: Rachael Feeaniel P Jacobs, MD;  Location: [facility];  Service: Endoscopy;  Laterality: N/A;  . KIDNEY STONE SURGERY  2009  . LITHOTRIPSY  12/2015  . WISDOM TOOTH EXTRACTION  10/2002    Family History  Problem Relation Age of Onset  . Cancer Paternal Grandfather        liver  . Alcohol abuse Paternal Grandfather   . Hyperlipidemia Mother   . Diabetes Father   . Hypertension Father   . Alcohol abuse Father   . Frontotemporal dementia Father        tentative (still undergoing work up -- 03/2017)  . Alzheimer's disease Maternal Grandmother   . Arthritis Maternal Grandmother   . Parkinson's disease Maternal Grandfather   . Stroke Paternal Grandmother   . Heart disease Paternal Grandmother   . Other Neg Hx     Social  History   Tobacco Use  . Smoking status: Never Smoker  . Smokeless tobacco: Never Used  Substance Use Topics  . Alcohol use: Yes    Alcohol/week: 0.0 oz    Comment: occasional  . Drug use: No    Prior to Admission medications   Medication Sig Start Date End Date Taking? Authorizing Provider  albuterol (PROVENTIL HFA;VENTOLIN HFA) 108 (90 Base) MCG/ACT inhaler Inhale 2 puffs into the lungs every 6 (six) hours as needed for wheezing or shortness of breath. 08/21/17   Kuneff, Renee A, DO  ISOtretinoin (ACCUTANE) 30 MG capsule Take 30 mg by mouth daily.    [provider]  predniSONE (DELTASONE) 20 MG tablet Take 2 tablets (40 mg total) by mouth daily with breakfast. 08/21/17   Kuneff, Renee A, DO    Allergies Hydrocodone   REVIEW OF SYSTEMS  Negative except as noted here or in the History of Present Illness.   PHYSICAL EXAMINATION  Initial Vital Signs Blood pressure 118/73, pulse (!) 111, temperature 98.3 F (36.8 C), temperature source Oral, resp. rate 20, height 5\' 4"  (1.626 m), weight 47.6 kg (105 lb), last menstrual period 01/10/2018, SpO2 100 %.  Examination General: Well-developed, thin female in no acute distress; appearance consistent with age of record HENT: normocephalic; atraumatic Eyes:  pupils equal, round and reactive to light; extraocular muscles intact Neck: supple Heart: regular rate and rhythm Lungs: clear to auscultation bilaterally Abdomen: soft; nondistended; epigastric tenderness; no masses or hepatosplenomegaly; bowel sounds present Extremities: No deformity; full range of motion; pulses normal Neurologic: Awake, alert and oriented; motor function intact in all extremities and symmetric; no facial droop Skin: Warm and dry Psychiatric: Normal mood and affect   RESULTS  Summary of this visit's results, reviewed by myself:   EKG Interpretation  Date/Time:    Ventricular Rate:    PR Interval:    QRS Duration:   QT Interval:    QTC  Calculation:   R Axis:     Text Interpretation:        Laboratory Studies: Results for orders placed or performed during the hospital encounter of 01/29/18 (from the past 24 hour(s))  Urinalysis, Routine w reflex microscopic     Status: Abnormal   Collection Time: 01/29/18 12:20 AM  Result Value Ref Range   Color, Urine YELLOW YELLOW   APPearance CLEAR CLEAR   Specific Gravity, Urine 1.010 1.005 - 1.030   pH 8.5 (H) 5.0 - 8.0   Glucose, UA NEGATIVE NEGATIVE mg/dL   Hgb urine dipstick NEGATIVE NEGATIVE   Bilirubin Urine NEGATIVE NEGATIVE   Ketones, ur 15 (A) NEGATIVE mg/dL   Protein, ur NEGATIVE NEGATIVE mg/dL   Nitrite NEGATIVE NEGATIVE   Leukocytes, UA NEGATIVE NEGATIVE  Pregnancy, urine     Status: None   Collection Time: 01/29/18 12:20 AM  Result Value Ref Range   Preg Test, Ur NEGATIVE NEGATIVE  CBC with Differential     Status: Abnormal   Collection Time: 01/29/18 12:52 AM  Result Value Ref Range   WBC 7.4 4.0 - 10.5 K/uL   RBC 4.38 3.87 - 5.11 MIL/uL   Hemoglobin 13.3 12.0 - 15.0 g/dL   HCT 40.9 81.1 - 91.4 %   MCV 91.3 78.0 - 100.0 fL   MCH 30.4 26.0 - 34.0 pg   MCHC 33.3 30.0 - 36.0 g/dL   RDW 78.2 95.6 - 21.3 %   Platelets 227 150 - 400 K/uL   Neutrophils Relative % 84 %   Neutro Abs 6.3 1.7 - 7.7 K/uL   Lymphocytes Relative 8 %   Lymphs Abs 0.6 (L) 0.7 - 4.0 K/uL   Monocytes Relative 8 %   Monocytes Absolute 0.6 0.1 - 1.0 K/uL   Eosinophils Relative 0 %   Eosinophils Absolute 0.0 0.0 - 0.7 K/uL   Basophils Relative 0 %   Basophils Absolute 0.0 0.0 - 0.1 K/uL  Comprehensive metabolic panel     Status: Abnormal   Collection Time: 01/29/18 12:52 AM  Result Value Ref Range   Sodium 137 135 - 145 mmol/L   Potassium 3.7 3.5 - 5.1 mmol/L   Chloride 103 101 - 111 mmol/L   CO2 25 22 - 32 mmol/L   Glucose, Bld 127 (H) 65 - 99 mg/dL   BUN 11 6 - 20 mg/dL   Creatinine, Ser 0.86 0.44 - 1.00 mg/dL   Calcium 9.0 8.9 - 57.8 mg/dL   Total Protein 7.0 6.5 - 8.1 g/dL    Albumin 4.2 3.5 - 5.0 g/dL   AST 469 (H) 15 - 41 U/L   ALT 98 (H) 14 - 54 U/L   Alkaline Phosphatase 77 38 - 126 U/L   Total Bilirubin 1.0 0.3 - 1.2 mg/dL   GFR calc non Af Amer >60 >60 mL/min   GFR  calc Af Amer >60 >60 mL/min   Anion gap 9 5 - 15  Lipase, blood     Status: None   Collection Time: 01/29/18 12:52 AM  Result Value Ref Range   Lipase 28 11 - 51 U/L   Imaging Studies: No results found.  ED COURSE  Nursing notes and initial vitals signs, including pulse oximetry, reviewed.  Vitals:   01/29/18 0023  BP: 118/73  Pulse: (!) 111  Resp: 20  Temp: 98.3 F (36.8 C)  TempSrc: Oral  SpO2: 100%  Weight: 47.6 kg (105 lb)  Height: 5\' 4"  (1.626 m)   2:27 AM Pain relieved with IV Toradol.  Her transaminases are elevated but this is a chronic problem for her. She states she is ready to go home.  She does not wish any prescriptions.  PROCEDURES    ED DIAGNOSES     ICD-10-CM   1. Epigastric pain R10.13   2. Nausea vomiting and diarrhea R11.2    R19.7        Trigo Winterbottom, Jonny Ruiz, MD 01/29/18 [phone]

## 2018-01-29 NOTE — ED Notes (Signed)
Given ginger ale 

## 2018-01-29 NOTE — ED Triage Notes (Signed)
Bilateral mid back pain as well as epigastric pain since this morning with n/v that started an hour ago.  Pt took phenergan at home and it did not help.  Denies sick contacts, denies diarrhea, c/o mild urinary discomfort.

## 2018-01-31 ENCOUNTER — Emergency Department (HOSPITAL_BASED_OUTPATIENT_CLINIC_OR_DEPARTMENT_OTHER)
Admission: EM | Admit: 2018-01-31 | Discharge: 2018-01-31 | Disposition: A | Discharge status: Home / Self Care | Payer: Managed Care, Other (non HMO) | Attending: Emergency Medicine | Admitting: Emergency Medicine

## 2018-01-31 ENCOUNTER — Encounter (HOSPITAL_BASED_OUTPATIENT_CLINIC_OR_DEPARTMENT_OTHER): Payer: Self-pay | Admitting: Emergency Medicine

## 2018-01-31 ENCOUNTER — Other Ambulatory Visit: Payer: Self-pay

## 2018-01-31 DIAGNOSIS — Z79899 Other long term (current) drug therapy: Secondary | ICD-10-CM | POA: Diagnosis not present

## 2018-01-31 DIAGNOSIS — R531 Weakness: Secondary | ICD-10-CM | POA: Insufficient documentation

## 2018-01-31 DIAGNOSIS — R110 Nausea: Secondary | ICD-10-CM | POA: Insufficient documentation

## 2018-01-31 DIAGNOSIS — Z7982 Long term (current) use of aspirin: Secondary | ICD-10-CM | POA: Insufficient documentation

## 2018-01-31 DIAGNOSIS — J45909 Unspecified asthma, uncomplicated: Secondary | ICD-10-CM | POA: Insufficient documentation

## 2018-01-31 DIAGNOSIS — R0602 Shortness of breath: Secondary | ICD-10-CM | POA: Insufficient documentation

## 2018-01-31 DIAGNOSIS — R197 Diarrhea, unspecified: Secondary | ICD-10-CM

## 2018-01-31 LAB — COMPREHENSIVE METABOLIC PANEL
ALT: 173 U/L — AB (ref 14–54)
AST: 54 U/L — AB (ref 15–41)
Albumin: 4.4 g/dL (ref 3.5–5.0)
Alkaline Phosphatase: 98 U/L (ref 38–126)
Anion gap: 13 (ref 5–15)
BILIRUBIN TOTAL: 0.7 mg/dL (ref 0.3–1.2)
BUN: 10 mg/dL (ref 6–20)
CALCIUM: 9 mg/dL (ref 8.9–10.3)
CO2: 20 mmol/L — ABNORMAL LOW (ref 22–32)
CREATININE: 0.56 mg/dL (ref 0.44–1.00)
Chloride: 103 mmol/L (ref 101–111)
GFR calc Af Amer: >60 mL/min (ref >60)
GFR calc non Af Amer: >60 mL/min (ref >60)
Glucose, Bld: 78 mg/dL (ref 65–99)
Potassium: 3.5 mmol/L (ref 3.5–5.1)
Sodium: 136 mmol/L (ref 135–145)
TOTAL PROTEIN: 7.4 g/dL (ref 6.5–8.1)

## 2018-01-31 LAB — CBC WITH DIFFERENTIAL/PLATELET
BASOS ABS: 0 10*3/uL (ref 0.0–0.1)
Basophils Relative: 0 %
Eosinophils Absolute: 0.1 10*3/uL (ref 0.0–0.7)
Eosinophils Relative: 2 %
HEMATOCRIT: 42.6 % (ref 36.0–46.0)
Hemoglobin: 14.2 g/dL (ref 12.0–15.0)
LYMPHS ABS: 1.3 10*3/uL (ref 0.7–4.0)
LYMPHS PCT: 23 %
MCH: 30.6 pg (ref 26.0–34.0)
MCHC: 33.3 g/dL (ref 30.0–36.0)
MCV: 91.8 fL (ref 78.0–100.0)
MONO ABS: 0.8 10*3/uL (ref 0.1–1.0)
Monocytes Relative: 13 %
NEUTROS ABS: 3.5 10*3/uL (ref 1.7–7.7)
Neutrophils Relative %: 62 %
Platelets: 192 10*3/uL (ref 150–400)
RBC: 4.64 MIL/uL (ref 3.87–5.11)
RDW: 12.3 % (ref 11.5–15.5)
WBC: 5.6 10*3/uL (ref 4.0–10.5)

## 2018-01-31 LAB — LIPASE, BLOOD: LIPASE: 24 U/L (ref 11–51)

## 2018-01-31 MED ORDER — ONDANSETRON HCL 4 MG/2ML IJ SOLN
4.0000 mg | Freq: Once | INTRAMUSCULAR | Status: AC
  Administered 2018-01-31: 4 mg via INTRAVENOUS
  Filled 2018-01-31: qty 2

## 2018-01-31 MED ORDER — SODIUM CHLORIDE 0.9 % IV BOLUS (SEPSIS)
1000.0000 mL | Freq: Once | INTRAVENOUS | Status: AC
  Administered 2018-01-31: 1000 mL via INTRAVENOUS

## 2018-01-31 MED ORDER — LOPERAMIDE HCL 2 MG PO CAPS: 2.0000 mg | ORAL_CAPSULE | Freq: Four times a day (QID) | ORAL | 0 refills | Status: DC | PRN

## 2018-01-31 MED ORDER — LOPERAMIDE HCL 2 MG PO CAPS
4.0000 mg | ORAL_CAPSULE | Freq: Once | ORAL | Status: AC
  Administered 2018-01-31: 4 mg via ORAL
  Filled 2018-01-31: qty 2

## 2018-01-31 MED ORDER — SODIUM CHLORIDE 0.9 % IV BOLUS (SEPSIS)
2000.0000 mL | Freq: Once | INTRAVENOUS | Status: AC
  Administered 2018-01-31: 2000 mL via INTRAVENOUS

## 2018-01-31 MED ORDER — KETOROLAC TROMETHAMINE 30 MG/ML IJ SOLN
30.0000 mg | Freq: Once | INTRAMUSCULAR | Status: AC
  Administered 2018-01-31: 30 mg via INTRAVENOUS
  Filled 2018-01-31: qty 1

## 2018-01-31 MED ORDER — ONDANSETRON 4 MG PO TBDP: ORAL_TABLET | ORAL | 0 refills | Status: DC

## 2018-01-31 NOTE — ED Notes (Signed)
Assumed care of patient from Lupita LeashDonna, [place]. PT resting quietly at this time. No distress. Warm blanket given.

## 2018-01-31 NOTE — ED Notes (Signed)
Pt reports feeling like her heart is racing, she states it is harder to take a deep breath in. EKG obtained. VSS. Dr. Fredderick PhenixBelfi notified.

## 2018-01-31 NOTE — ED Notes (Signed)
ED Provider at bedside. 

## 2018-01-31 NOTE — ED Provider Notes (Addendum)
[facility] EMERGENCY DEPARTMENT Provider Note   CSN: [id number] Arrival date & time: 01/31/18  0601     History   Chief Complaint Chief Complaint  Patient presents with  . Diarrhea    HPI Carrie Combs is a 32 y.o. female.  HPI Patient presents to the emergency room for evaluation of diarrhea and weakness.  Patient states she was seen in the emergency room on March 1.  She had an evaluation that included laboratory tests.  Patient states she was released from the ED and also followed up with a GI doctor.  She was told she had issues with her gallbladder and possibly her pancreas.  She was started on colestipol.  Patient states for the last couple of days however she started having multiple episodes of diarrhea.  Yesterday she had almost 20 episodes.  Today she had several as well.  She feels cramping in her arms.  She denies any vomiting but has had some nausea.  He was having some abdominal pain for days ago but that has resolved.  No recent antibiotics.  No foreign travel. Past Medical History:  Diagnosis Date  . Abnormal LFTs    being evaluated for this  . Asthma    exercise-induced  . Kidney stones   . Migraine    monthly with menses  . PONV (postoperative nausea and vomiting)     Patient Active Problem List   Diagnosis Date Noted  . Preventative health care 03/09/2016  . Nephrolithiasis 01/01/2016  . Hx of migraines 01/01/2016  . Exercise-induced asthma 01/01/2016    Past Surgical History:  Procedure Laterality Date  . CHOLECYSTECTOMY  2003   laparoscopic  . EUS N/A 05/01/2016   Procedure: UPPER ENDOSCOPIC ULTRASOUND (EUS) LINEAR;  Surgeon: Rachael Fee, MD;  Location: [facility];  Service: Endoscopy;  Laterality: N/A;  . KIDNEY STONE SURGERY  2009  . LITHOTRIPSY  12/2015  . WISDOM TOOTH EXTRACTION  10/2002    OB History    Gravida Para Term Preterm AB Living   2 2 2  0 0 2   SAB TAB Ectopic Multiple Live Births   0 0 0 0 2       Home  Medications    Prior to Admission medications   Medication Sig Start Date End Date Taking? Authorizing Provider  aspirin 81 MG chewable tablet Chew 81 mg by mouth daily.   Yes [provider]  colestipol (COLESTID) 1 g tablet Take 1 g by mouth 2 (two) times daily.   Yes [provider]  albuterol (PROVENTIL HFA;VENTOLIN HFA) 108 (90 Base) MCG/ACT inhaler Inhale 2 puffs into the lungs every 6 (six) hours as needed for wheezing or shortness of breath. 08/21/17   Kuneff, Renee A, DO  ISOtretinoin (ACCUTANE) 30 MG capsule Take 30 mg by mouth daily.    [provider]  loperamide (IMODIUM) 2 MG capsule Take 1 capsule (2 mg total) by mouth 4 (four) times daily as needed for diarrhea or loose stools. 01/31/18   Linwood Dibbles, MD  predniSONE (DELTASONE) 20 MG tablet Take 2 tablets (40 mg total) by mouth daily with breakfast. 08/21/17   Claiborne Billings, Renee A, DO    Family History Family History  Problem Relation Age of Onset  . Cancer Paternal Grandfather        liver  . Alcohol abuse Paternal Grandfather   . Hyperlipidemia Mother   . Diabetes Father   . Hypertension Father   . Alcohol abuse Father   .  Frontotemporal dementia Father        tentative (still undergoing work up -- 03/2017)  . Alzheimer's disease Maternal Grandmother   . Arthritis Maternal Grandmother   . Parkinson's disease Maternal Grandfather   . Stroke Paternal Grandmother   . Heart disease Paternal Grandmother   . Other Neg Hx     Social History Social History   Tobacco Use  . Smoking status: Never Smoker  . Smokeless tobacco: Never Used  Substance Use Topics  . Alcohol use: Yes    Alcohol/week: 0.0 oz    Comment: occasional  . Drug use: No     Allergies   Hydrocodone   Review of Systems Review of Systems  All other systems reviewed and are negative.    Physical Exam Updated Vital Signs BP 109/73 (BP Location: Right Arm)   Pulse 88   Temp 97.8 F (36.6 C) (Oral)   Resp 18   Ht 1.626  m (5\' 4" )   Wt 47.6 kg (105 lb)   LMP 01/10/2018   SpO2 100%   BMI 18.02 kg/m   Physical Exam  Constitutional: She appears well-developed and well-nourished. No distress.  HENT:  Head: Normocephalic and atraumatic.  Right Ear: External ear normal.  Left Ear: External ear normal.  Eyes: Conjunctivae are normal. Right eye exhibits no discharge. Left eye exhibits no discharge. No scleral icterus.  Neck: Neck supple. No tracheal deviation present.  Cardiovascular: Normal rate, regular rhythm and intact distal pulses.  Pulmonary/Chest: Effort normal and breath sounds normal. No stridor. No respiratory distress. She has no wheezes. She has no rales.  Abdominal: Soft. Bowel sounds are normal. She exhibits no distension. There is no tenderness. There is no rebound and no guarding.  Musculoskeletal: She exhibits no edema or tenderness.  Neurological: She is alert. She has normal strength. No cranial nerve deficit (no facial droop, extraocular movements intact, no slurred speech) or sensory deficit. She exhibits normal muscle tone. She displays no seizure activity. Coordination normal.  Skin: Skin is warm and dry. No rash noted. There is pallor.  Psychiatric: She has a normal mood and affect.  Nursing note and vitals reviewed.    ED Treatments / Results  Labs (all labs ordered are listed, but only abnormal results are displayed) Labs Reviewed  COMPREHENSIVE METABOLIC PANEL - Abnormal; Notable for the following components:      Result Value   CO2 20 (*)    AST 54 (*)    ALT 173 (*)    All other components within normal limits  CBC WITH DIFFERENTIAL/PLATELET  LIPASE, BLOOD     Procedures Procedures (including critical care time)  Medications Ordered in ED Medications  sodium chloride 0.9 % bolus 2,000 mL (2,000 mLs Intravenous New Bag/Given 01/31/18 7829)  ondansetron (ZOFRAN) injection 4 mg (4 mg Intravenous Given 01/31/18 5621)  loperamide (IMODIUM) capsule 4 mg (4 mg Oral Given  01/31/18 0645)     Initial Impression / Assessment and Plan / ED Course  I have reviewed the triage vital signs and the nursing notes.  Pertinent labs & imaging results that were available during my care of the patient were reviewed by me and considered in my medical decision making (see chart for details).  Clinical Course as of Feb 01 704  Sun Jan 31, 2018  0629 Negative HCG on 3/1  [JK]  0632 LFTs were elevated on 3/1.  Lipase was normal  [JK]    Clinical Course User Index [JK] Linwood Dibbles, MD  Pt denies abdominal pain.  Abd exam is benign.  Will check labs and give IV fluids.   Labs are reassuring.  No electrolyte abnormalities.  LFT abnormalities persist but not significantly changed.  Will dc home with rx for imodium after iv fluid hydration  Final Clinical Impressions(s) / ED Diagnoses   Final diagnoses:  Diarrhea, unspecified type    ED Discharge Orders        Ordered    loperamide (IMODIUM) 2 MG capsule  4 times daily PRN     01/31/18 0705         Linwood DibblesKnapp, Deah Ottaway, MD 01/31/18 [phone]

## 2018-01-31 NOTE — Discharge Instructions (Signed)
Follow up with your gi doctor, take the medications for diarrhea, return as needed for worsening symptoms

## 2018-01-31 NOTE — ED Provider Notes (Signed)
Care was taken over from Dr. Lynelle DoctorKnapp.  Patient reports diarrhea for the last several days.  Her labs were reviewed.  She has some mild elevation in her LFTs which is a known problem.  Her symptoms have improved in the ED after fluids.  She initially had tachycardia which has resolved with IV fluids.  Her abdominal pain and nausea has improved.  She is able to tolerate oral fluids.  She is going to follow-up with her gastroenterologist this week.  She was given a prescription for Zofran.  She also is on other medications prescribed by her gastroenterologist.  Return precautions were given.  At one point she did have some palpitations and an EKG was performed which is non-concerning.  ED ECG REPORT   Date: 01/31/2018  Rate: 81   Rhythm: normal sinus rhythm  QRS Axis: normal  Intervals: normal  ST/T Wave abnormalities: normal  Conduction Disutrbances:none  Narrative Interpretation:   Old EKG Reviewed: none available  I have personally reviewed the EKG tracing and agree with the computerized printout as noted.    Rolan BuccoBelfi, Deshay Blumenfeld, MD 01/31/18 1026

## 2018-01-31 NOTE — ED Notes (Signed)
Pt states she was seen here last week and f/u with GI as instructed. Given meds, but yesterday continued having diarrhea. 25-30 watery stools since. Also c/o nausea.

## 2018-01-31 NOTE — ED Notes (Signed)
Pt able to intake a whole cup of ginger ale successfully.

## 2018-01-31 NOTE — ED Triage Notes (Signed)
Reports diarrhea, and nausea. Pt states she was recently dx with gallstones and pancreatitis, was given medication to help with diarrhea. Pt states meds are not helping. She is now having muscle cramping as well.

## 2018-03-23 ENCOUNTER — Encounter: Payer: Self-pay | Admitting: Family

## 2018-03-23 ENCOUNTER — Ambulatory Visit (INDEPENDENT_AMBULATORY_CARE_PROVIDER_SITE_OTHER): Payer: Managed Care, Other (non HMO) | Admitting: Family

## 2018-03-23 VITALS — BP 101/60 | HR 70 | Resp 16 | Ht 64.0 in | Wt 106.0 lb

## 2018-03-23 DIAGNOSIS — Z0000 Encounter for general adult medical examination without abnormal findings: Secondary | ICD-10-CM | POA: Diagnosis not present

## 2018-03-23 LAB — CBC WITH DIFFERENTIAL/PLATELET
Basophils Absolute: 0 10*3/uL (ref 0.0–0.1)
Basophils Relative: 0.8 % (ref 0.0–3.0)
EOS ABS: 0.2 10*3/uL (ref 0.0–0.7)
EOS PCT: 3.4 % (ref 0.0–5.0)
HCT: 39.7 % (ref 36.0–46.0)
Hemoglobin: 13.2 g/dL (ref 12.0–15.0)
Lymphocytes Relative: 35.9 % (ref 12.0–46.0)
Lymphs Abs: 2.1 10*3/uL (ref 0.7–4.0)
MCHC: 33.3 g/dL (ref 30.0–36.0)
MCV: 91.4 fl (ref 78.0–100.0)
MONO ABS: 0.4 10*3/uL (ref 0.1–1.0)
Monocytes Relative: 7.3 % (ref 3.0–12.0)
NEUTROS PCT: 52.6 % (ref 43.0–77.0)
Neutro Abs: 3.1 10*3/uL (ref 1.4–7.7)
Platelets: 249 10*3/uL (ref 150.0–400.0)
RBC: 4.34 Mil/uL (ref 3.87–5.11)
RDW: 13.1 % (ref 11.5–15.5)
WBC: 5.9 10*3/uL (ref 4.0–10.5)

## 2018-03-23 LAB — HEPATIC FUNCTION PANEL
ALT: 13 U/L (ref 0–35)
AST: 17 U/L (ref 0–37)
Albumin: 4.4 g/dL (ref 3.5–5.2)
Alkaline Phosphatase: 64 U/L (ref 39–117)
BILIRUBIN TOTAL: 0.5 mg/dL (ref 0.2–1.2)
Bilirubin, Direct: 0.1 mg/dL (ref 0.0–0.3)
Total Protein: 6.9 g/dL (ref 6.0–8.3)

## 2018-03-23 LAB — LIPID PANEL
CHOLESTEROL: 180 mg/dL (ref 0–200)
HDL: 63.7 mg/dL (ref >39.00)
LDL CALC: 103 mg/dL — AB (ref 0–99)
NonHDL: 115.88
TRIGLYCERIDES: 63 mg/dL (ref 0.0–149.0)
Total CHOL/HDL Ratio: 3
VLDL: 12.6 mg/dL (ref 0.0–40.0)

## 2018-03-23 LAB — URINALYSIS, ROUTINE W REFLEX MICROSCOPIC
Bilirubin Urine: NEGATIVE
HGB URINE DIPSTICK: NEGATIVE
Ketones, ur: NEGATIVE
Leukocytes, UA: NEGATIVE
Nitrite: NEGATIVE
Specific Gravity, Urine: 1.015 (ref 1.000–1.030)
Total Protein, Urine: NEGATIVE
Urine Glucose: NEGATIVE
Urobilinogen, UA: 0.2 (ref 0.0–1.0)
pH: 8.5 — AB (ref 5.0–8.0)

## 2018-03-23 LAB — BASIC METABOLIC PANEL
BUN: 11 mg/dL (ref 6–23)
CO2: 29 mEq/L (ref 19–32)
CREATININE: 0.55 mg/dL (ref 0.40–1.20)
Calcium: 9.2 mg/dL (ref 8.4–10.5)
Chloride: 102 mEq/L (ref 96–112)
GFR: 134.61 mL/min (ref >60.00)
Glucose, Bld: 83 mg/dL (ref 70–99)
POTASSIUM: 4.3 meq/L (ref 3.5–5.1)
Sodium: 135 mEq/L (ref 135–145)

## 2018-03-23 LAB — TSH: TSH: 1.51 u[IU]/mL (ref 0.35–4.50)

## 2018-03-23 NOTE — Patient Instructions (Addendum)
Please schedule a routine eye exam. Complete lab work prior to leaving. Continue healthy diet, and regular exercise.

## 2018-03-23 NOTE — Progress Notes (Signed)
Subjective:    Patient ID: Carrie Combs, female    DOB: [birth year], [age] y.o.   MRN: [id number]  HPI  Patient presents today for complete physical.  Immunizations: tdap 2013 Diet: healthy Exercise: 2-3 times a week, running/pilates Pap Smear: 3/18- sees GYN Dental: up to date Vision: due   Went to GI and was told that she has pancreatitis and was placed on a baby aspirin every day.  Thinks she may has been is working on a lower sugar lower fat diet.  Has follow up in June. (Dr. Mariana KaufmanWilliam Bray).  She has discontinued alcohol.    Wt Readings from Last 3 Encounters:  03/23/18 106 lb (48.1 kg)  01/31/18 105 lb (47.6 kg)  01/29/18 105 lb (47.6 kg)  pt is fasting  Review of Systems  Constitutional: Negative for unexpected weight change.  HENT: Negative for hearing loss and rhinorrhea.   Eyes: Negative for visual disturbance.  Respiratory: Negative for cough.   Cardiovascular: Negative for leg swelling.  Gastrointestinal:       Reports that she has had som constipation  Genitourinary: Negative for dysuria, frequency and menstrual problem.  Musculoskeletal: Negative for arthralgias and myalgias.  Skin: Negative for rash.  Neurological: Negative for headaches.  Hematological: Negative for adenopathy.  Psychiatric/Behavioral:       Denies depression/anxiety   Past Medical History:  Diagnosis Date  . Abnormal LFTs    being evaluated for this  . Asthma    exercise-induced  . Kidney stones   . Migraine    monthly with menses  . PONV (postoperative nausea and vomiting)      Social History   Socioeconomic History  . Marital status: Married    Spouse name: Not on file  . Number of children: Not on file  . Years of education: Not on file  . Highest education level: Not on file  Occupational History  . Not on file  Social Needs  . Financial resource strain: Not on file  . Food insecurity:    Worry: Not on file    Inability: Not on file  . Transportation needs:   Medical: Not on file    Non-medical: Not on file  Tobacco Use  . Smoking status: Never Smoker  . Smokeless tobacco: Never Used  Substance and Sexual Activity  . Alcohol use: Yes    Alcohol/week: 0.0 oz    Comment: occasional  . Drug use: No  . Sexual activity: Yes  Lifestyle  . Physical activity:    Days per week: Not on file    Minutes per session: Not on file  . Stress: Not on file  Relationships  . Social connections:    Talks on phone: Not on file    Gets together: Not on file    Attends religious service: Not on file    Active member of club or organization: Not on file    Attends meetings of clubs or organizations: Not on file    Relationship status: Not on file  . Intimate partner violence:    Fear of current or ex partner: Not on file    Emotionally abused: Not on file    Physically abused: Not on file    Forced sexual activity: Not on file  Other Topics Concern  . 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    Completed college  Enjoys spending time with friends, reading.    Completed college    Past Surgical History:  Procedure Laterality Date  . CHOLECYSTECTOMY  2003   laparoscopic  . EUS N/A 05/01/2016   Procedure: UPPER ENDOSCOPIC ULTRASOUND (EUS) LINEAR;  Surgeon: Rachael Fee, MD;  Location: [facility];  Service: Endoscopy;  Laterality: N/A;  . KIDNEY STONE SURGERY  2009  . LITHOTRIPSY  12/2015  . WISDOM TOOTH EXTRACTION  10/2002    Family History  Problem Relation Age of Onset  . Cancer Paternal Grandfather        liver  . Alcohol abuse Paternal Grandfather   . Hyperlipidemia Mother   . Diabetes Father   . Hypertension Father   . Alcohol abuse Father   . Frontotemporal dementia Father        tentative (still undergoing work up -- 03/2017)  . Alzheimer's disease Maternal Grandmother   . Arthritis Maternal Grandmother   . Parkinson's disease Maternal  Grandfather   . Stroke Paternal Grandmother   . Heart disease Paternal Grandmother   . Other Neg Hx     Allergies  Allergen Reactions  . Hydrocodone Nausea And Vomiting    Current Outpatient Medications on File Prior to Visit  Medication Sig Dispense Refill  . albuterol (PROVENTIL HFA;VENTOLIN HFA) 108 (90 Base) MCG/ACT inhaler Inhale 2 puffs into the lungs every 6 (six) hours as needed for wheezing or shortness of breath. 1 Inhaler 0  . aspirin 81 MG chewable tablet Chew 81 mg by mouth daily.    . ondansetron (ZOFRAN ODT) 4 MG disintegrating tablet 4mg  ODT q4 hours prn nausea/vomit 4 tablet 0   No current facility-administered medications on file prior to visit.     BP 101/60 (BP Location: Left Arm, Patient Position: Sitting, Cuff Size: Normal)   Pulse 70   Resp 16   Ht 5\' 4"  (1.626 m)   Wt 106 lb (48.1 kg)   SpO2 99%   BMI 18.19 kg/m        Objective:   Physical Exam  Physical Exam  Constitutional: She is oriented to person, place, and time. She appears well-developed and well-nourished. No distress.  HENT:  Head: Normocephalic and atraumatic.  Right Ear: Tympanic membrane and ear canal normal.  Left Ear: Tympanic membrane and ear canal normal.  Mouth/Throat: Oropharynx is clear and moist.  Eyes: Pupils are equal, round, and reactive to light. No scleral icterus.  Neck: Normal range of motion. No thyromegaly present.  Cardiovascular: Normal rate and regular rhythm.   No murmur heard. Pulmonary/Chest: Effort normal and breath sounds normal. No respiratory distress. He has no wheezes. She has no rales. She exhibits no tenderness.  Abdominal: Soft. Bowel sounds are normal. She exhibits no distension and no mass. There is no tenderness. There is no rebound and no guarding.  Musculoskeletal: She exhibits no edema.  Lymphadenopathy:    She has no cervical adenopathy.  Neurological: She is alert and oriented to person, place, and time. She has normal patellar reflexes.  She exhibits normal muscle tone. Coordination normal.  Skin: Skin is warm and dry.  Psychiatric: She has a normal mood and affect. Her behavior is normal. Judgment and thought content normal.  Breasts: Examined lying Right: Without masses, retractions, discharge or axillary adenopathy.  Left: Without masses, retractions, discharge or axillary adenopathy.  Pelvic: deferred        Assessment & Plan:    Preventative care- encouraged pt to continue healthy diet and exercise.  Obtain  routine lab work.  Pap and immunizations are up to date.      Assessment & Plan:

## 2018-03-24 ENCOUNTER — Encounter: Payer: Self-pay | Admitting: Family

## 2018-03-24 NOTE — Telephone Encounter (Signed)
Melissa-- do you have this paperwork?

## 2018-03-25 NOTE — Telephone Encounter (Signed)
Relation to pt: self  Call back number: [phone]    Reason for call:  Patient checking on the status of message below, patient states form due 03/26/18 and if need be can bring in another form. Patient would like a follow up call, informed patient PCP is out of the office and will return tomorrow 03/26/18, please advise

## 2018-03-30 NOTE — Telephone Encounter (Signed)
Form was faxed on 03/26/18 at 5pm and 03/30/18 at 10:42am.

## 2018-09-24 ENCOUNTER — Encounter: Payer: Self-pay | Admitting: Family Medicine

## 2018-09-24 ENCOUNTER — Ambulatory Visit: Payer: Managed Care, Other (non HMO) | Admitting: Family Medicine

## 2018-09-24 VITALS — BP 92/62 | HR 81 | Temp 98.2°F | Ht 64.0 in | Wt 111.0 lb

## 2018-09-24 DIAGNOSIS — J029 Acute pharyngitis, unspecified: Secondary | ICD-10-CM | POA: Diagnosis not present

## 2018-09-24 MED ORDER — METHYLPREDNISOLONE ACETATE 80 MG/ML IJ SUSP
80.0000 mg | Freq: Once | INTRAMUSCULAR | Status: AC
  Administered 2018-09-24: 80 mg via INTRAMUSCULAR

## 2018-09-24 MED ORDER — AMOXICILLIN 500 MG PO TABS: 1000.0000 mg | ORAL_TABLET | Freq: Every day | ORAL | 0 refills | Status: DC

## 2018-09-24 NOTE — Progress Notes (Signed)
Pre visit review using our clinic review tool, if applicable. No additional management support is needed unless otherwise documented below in the visit note. 

## 2018-09-24 NOTE — Patient Instructions (Addendum)
Continue to push fluids, practice good hand hygiene, and cover your mouth if you cough.  If you start having fevers, shaking or shortness of breath, seek immediate care.  OK to take Tylenol 1000 mg (2 extra strength tabs) or 975 mg (3 regular strength tabs) every 6 hours as needed.  Ibuprofen 400-600 mg (2-3 over the counter strength tabs) every 6 hours as needed for pain.  Give [place] 2 days to get the results of your culture back.   Throw out your toothbrush after being on antibiotic for 24 hours.   Let us know if you need anything.

## 2018-09-24 NOTE — Progress Notes (Signed)
Chief Complaint  Patient presents with  . Sore Throat    head pressure    Carrie Combs here for URI complaints.  Duration: 3 days  Associated symptoms: sinus headache, muffled ears, sore throat and myalgia Denies: sinus congestion, rhinorrhea, itchy watery eyes, ear pain, ear drainage, wheezing, shortness of breath, cough and fevers Treatment to date: Dayquil Sick contacts: Yes- Sick contacts at work and her children  ROS:  Const: Denies fevers HEENT: As noted in HPI Lungs: No SOB  Past Medical History:  Diagnosis Date  . Abnormal LFTs    being evaluated for this  . Asthma    exercise-induced  . Kidney stones   . Migraine    monthly with menses  . PONV (postoperative nausea and vomiting)     BP 92/62 (BP Location: Left Arm, Patient Position: Sitting, Cuff Size: Normal)   Pulse 81   Temp 98.2 F (36.8 C) (Oral)   Ht 5\' 4"  (1.626 m)   Wt 111 lb (50.3 kg)   SpO2 98%   BMI 19.05 kg/m  General: Awake, alert, appears stated age HEENT: AT, [place], ears patent b/l and TM's neg, nares patent w/o discharge, pharynx moderately erythematous and with L sided exudate, no asymmetry, MMM Neck: No masses or asymmetry, no tender adenopathy Heart: RRR Lungs: CTAB, no accessory muscle use Psych: Age appropriate judgment and insight, normal mood and affect  Sore throat - Plan: amoxicillin (AMOXIL) 500 MG tablet, methylPREDNISolone acetate (DEPO-MEDROL) injection 80 mg  Orders as above. Ck culture. If neg, likely viral. Rec'd throwing out toothbrush after 24 hrs on abx. Discussed option of waiting for results of culture but pt wished to start tx today.  Ibuprofen/Tylenol.  Continue to push fluids, practice good hand hygiene, cover mouth when coughing. F/u prn. If starting to experience fevers, shaking, or shortness of breath, seek immediate care. Pt voiced understanding and agreement to the plan.  Jilda Roche Honor, DO 09/24/18 3:48 PM

## 2018-09-24 NOTE — Addendum Note (Signed)
Addended by: Scharlene Gloss B on: 09/24/2018 03:56 PM   Modules accepted: Orders

## 2018-09-26 ENCOUNTER — Other Ambulatory Visit: Payer: Self-pay | Admitting: Family Medicine

## 2018-09-26 LAB — CULTURE, GROUP A STREP
MICRO NUMBER: 91286452
SPECIMEN QUALITY:: ADEQUATE

## 2019-01-15 ENCOUNTER — Encounter: Payer: Self-pay | Admitting: Family

## 2019-01-17 MED ORDER — OSELTAMIVIR PHOSPHATE 75 MG PO CAPS
75.0000 mg | ORAL_CAPSULE | Freq: Every day | ORAL | 0 refills | Status: AC
Start: 2019-01-17 — End: 2019-01-27

## 2019-06-24 LAB — HM PAP SMEAR

## 2019-12-13 ENCOUNTER — Other Ambulatory Visit (HOSPITAL_COMMUNITY)
Admission: RE | Admit: 2019-12-13 | Discharge: 2019-12-13 | Disposition: A | Discharge status: Home / Self Care | Payer: 59 | Source: Ambulatory Visit | Attending: Obstetrics and Gynecology | Admitting: Obstetrics and Gynecology

## 2019-12-13 ENCOUNTER — Encounter (HOSPITAL_BASED_OUTPATIENT_CLINIC_OR_DEPARTMENT_OTHER): Payer: Self-pay | Admitting: Obstetrics and Gynecology

## 2019-12-13 ENCOUNTER — Other Ambulatory Visit: Payer: Self-pay

## 2019-12-13 DIAGNOSIS — Z20822 Contact with and (suspected) exposure to covid-19: Secondary | ICD-10-CM | POA: Insufficient documentation

## 2019-12-13 DIAGNOSIS — Z01812 Encounter for preprocedural laboratory examination: Secondary | ICD-10-CM | POA: Diagnosis present

## 2019-12-13 NOTE — Progress Notes (Addendum)
Spoke w/ via phone for pre-op interview---[facility] needs dos----  Urine poct, cmet            Lab results------ COVID test ------12-16-2019 Arrive at -------1100 NPO after ------midnight food, clear liquids until 700 am then npo Medications to take morning of surgery -----albuterol inhaler prn/bring inhaler Diabetic medication -----n/a Patient Special Instructions ----- Pre-Op special Istructions ----- Patient verbalized understanding of instructions that were given at this phone interview. Patient denies shortness of breath, chest pain, fever, cough a this phone interview.

## 2019-12-15 LAB — NOVEL CORONAVIRUS, NAA (HOSP ORDER, SEND-OUT TO REF LAB; TAT 18-24 HRS): SARS-CoV-2, NAA: NOT DETECTED

## 2019-12-16 ENCOUNTER — Ambulatory Visit (HOSPITAL_BASED_OUTPATIENT_CLINIC_OR_DEPARTMENT_OTHER)
Admission: RE | Admit: 2019-12-16 | Discharge: 2019-12-16 | Disposition: A | Discharge status: Home / Self Care | Payer: 59 | Attending: Obstetrics and Gynecology | Admitting: Obstetrics and Gynecology

## 2019-12-16 ENCOUNTER — Ambulatory Visit (HOSPITAL_BASED_OUTPATIENT_CLINIC_OR_DEPARTMENT_OTHER): Payer: 59 | Admitting: Anesthesiology

## 2019-12-16 ENCOUNTER — Encounter (HOSPITAL_BASED_OUTPATIENT_CLINIC_OR_DEPARTMENT_OTHER): Payer: Self-pay | Admitting: Obstetrics and Gynecology

## 2019-12-16 ENCOUNTER — Other Ambulatory Visit: Payer: Self-pay

## 2019-12-16 ENCOUNTER — Encounter (HOSPITAL_BASED_OUTPATIENT_CLINIC_OR_DEPARTMENT_OTHER)
Admission: RE | Disposition: A | Discharge status: Home / Self Care | Payer: Self-pay | Source: Home / Self Care | Attending: Obstetrics and Gynecology

## 2019-12-16 ENCOUNTER — Other Ambulatory Visit: Payer: Self-pay | Admitting: Obstetrics and Gynecology

## 2019-12-16 DIAGNOSIS — Z79899 Other long term (current) drug therapy: Secondary | ICD-10-CM | POA: Insufficient documentation

## 2019-12-16 DIAGNOSIS — Z885 Allergy status to narcotic agent status: Secondary | ICD-10-CM | POA: Insufficient documentation

## 2019-12-16 DIAGNOSIS — N938 Other specified abnormal uterine and vaginal bleeding: Secondary | ICD-10-CM | POA: Diagnosis present

## 2019-12-16 DIAGNOSIS — N840 Polyp of corpus uteri: Secondary | ICD-10-CM | POA: Insufficient documentation

## 2019-12-16 DIAGNOSIS — Z7982 Long term (current) use of aspirin: Secondary | ICD-10-CM | POA: Diagnosis not present

## 2019-12-16 DIAGNOSIS — J45909 Unspecified asthma, uncomplicated: Secondary | ICD-10-CM | POA: Diagnosis not present

## 2019-12-16 HISTORY — PX: CERVICAL POLYPECTOMY: SHX88

## 2019-12-16 HISTORY — DX: Acute pancreatitis without necrosis or infection, unspecified: K85.90

## 2019-12-16 HISTORY — PX: HYSTEROSCOPY WITH D & C: SHX1775

## 2019-12-16 LAB — COMPREHENSIVE METABOLIC PANEL
ALT: 15 U/L (ref 0–44)
AST: 13 U/L — ABNORMAL LOW (ref 15–41)
Albumin: 4.8 g/dL (ref 3.5–5.0)
Alkaline Phosphatase: 69 U/L (ref 38–126)
Anion gap: 10 (ref 5–15)
BUN: 11 mg/dL (ref 6–20)
CO2: 26 mmol/L (ref 22–32)
Calcium: 9.4 mg/dL (ref 8.9–10.3)
Chloride: 102 mmol/L (ref 98–111)
Creatinine, Ser: 0.47 mg/dL (ref 0.44–1.00)
GFR calc Af Amer: >60 mL/min (ref >60)
GFR calc non Af Amer: >60 mL/min (ref >60)
Glucose, Bld: 94 mg/dL (ref 70–99)
Potassium: 4 mmol/L (ref 3.5–5.1)
Sodium: 138 mmol/L (ref 135–145)
Total Bilirubin: 0.9 mg/dL (ref 0.3–1.2)
Total Protein: 7.8 g/dL (ref 6.5–8.1)

## 2019-12-16 LAB — CBC
HCT: 43 % (ref 36.0–46.0)
Hemoglobin: 13.9 g/dL (ref 12.0–15.0)
MCH: 30.1 pg (ref 26.0–34.0)
MCHC: 32.3 g/dL (ref 30.0–36.0)
MCV: 93.1 fL (ref 80.0–100.0)
Platelets: 301 10*3/uL (ref 150–400)
RBC: 4.62 MIL/uL (ref 3.87–5.11)
RDW: 11.5 % (ref 11.5–15.5)
WBC: 7.2 10*3/uL (ref 4.0–10.5)
nRBC: 0 % (ref 0.0–0.2)

## 2019-12-16 LAB — POCT PREGNANCY, URINE: Preg Test, Ur: NEGATIVE

## 2019-12-16 SURGERY — DILATATION AND CURETTAGE /HYSTEROSCOPY
Anesthesia: General | Site: Vagina

## 2019-12-16 MED ORDER — IBUPROFEN 600 MG PO TABS: 600.0000 mg | ORAL_TABLET | Freq: Four times a day (QID) | ORAL | 0 refills | Status: AC | PRN

## 2019-12-16 MED ORDER — FENTANYL CITRATE (PF) 100 MCG/2ML IJ SOLN
INTRAMUSCULAR | Status: DC | PRN
  Administered 2019-12-16: 50 ug via INTRAVENOUS

## 2019-12-16 MED ORDER — KETOROLAC TROMETHAMINE 30 MG/ML IJ SOLN
INTRAMUSCULAR | Status: AC
  Filled 2019-12-16: qty 1

## 2019-12-16 MED ORDER — DEXAMETHASONE SODIUM PHOSPHATE 10 MG/ML IJ SOLN
INTRAMUSCULAR | Status: AC
  Filled 2019-12-16: qty 1

## 2019-12-16 MED ORDER — DEXAMETHASONE SODIUM PHOSPHATE 4 MG/ML IJ SOLN
INTRAMUSCULAR | Status: DC | PRN
  Administered 2019-12-16: 5 mg via INTRAVENOUS

## 2019-12-16 MED ORDER — DIPHENHYDRAMINE HCL 50 MG/ML IJ SOLN
INTRAMUSCULAR | Status: DC | PRN
  Administered 2019-12-16: 12.5 mg via INTRAVENOUS

## 2019-12-16 MED ORDER — MIDAZOLAM HCL 5 MG/5ML IJ SOLN
INTRAMUSCULAR | Status: DC | PRN
  Administered 2019-12-16: 2 mg via INTRAVENOUS

## 2019-12-16 MED ORDER — SCOPOLAMINE 1 MG/3DAYS TD PT72
MEDICATED_PATCH | TRANSDERMAL | Status: AC
  Filled 2019-12-16: qty 1

## 2019-12-16 MED ORDER — LIDOCAINE 2% (20 MG/ML) 5 ML SYRINGE
INTRAMUSCULAR | Status: AC
  Filled 2019-12-16: qty 5

## 2019-12-16 MED ORDER — KETOROLAC TROMETHAMINE 30 MG/ML IJ SOLN
INTRAMUSCULAR | Status: DC | PRN
  Administered 2019-12-16: 30 mg via INTRAVENOUS

## 2019-12-16 MED ORDER — FENTANYL CITRATE (PF) 100 MCG/2ML IJ SOLN
INTRAMUSCULAR | Status: AC
  Filled 2019-12-16: qty 2

## 2019-12-16 MED ORDER — DIPHENHYDRAMINE HCL 50 MG/ML IJ SOLN
INTRAMUSCULAR | Status: AC
  Filled 2019-12-16: qty 1

## 2019-12-16 MED ORDER — MIDAZOLAM HCL 2 MG/2ML IJ SOLN
INTRAMUSCULAR | Status: AC
  Filled 2019-12-16: qty 2

## 2019-12-16 MED ORDER — LACTATED RINGERS IV SOLN
INTRAVENOUS | Status: DC
  Filled 2019-12-16: qty 1000

## 2019-12-16 MED ORDER — LIDOCAINE HCL (CARDIAC) PF 100 MG/5ML IV SOSY
PREFILLED_SYRINGE | INTRAVENOUS | Status: DC | PRN
  Administered 2019-12-16: 50 mg via INTRAVENOUS

## 2019-12-16 MED ORDER — FENTANYL CITRATE (PF) 100 MCG/2ML IJ SOLN
25.0000 ug | INTRAMUSCULAR | Status: DC | PRN
  Administered 2019-12-16: 25 ug via INTRAVENOUS
  Filled 2019-12-16: qty 1

## 2019-12-16 MED ORDER — ONDANSETRON HCL 4 MG/2ML IJ SOLN
INTRAMUSCULAR | Status: AC
  Filled 2019-12-16: qty 2

## 2019-12-16 MED ORDER — SCOPOLAMINE 1 MG/3DAYS TD PT72
MEDICATED_PATCH | TRANSDERMAL | Status: DC | PRN
  Administered 2019-12-16: 1 via TRANSDERMAL

## 2019-12-16 MED ORDER — ONDANSETRON HCL 4 MG/2ML IJ SOLN
INTRAMUSCULAR | Status: DC | PRN
  Administered 2019-12-16: 4 mg via INTRAVENOUS

## 2019-12-16 MED ORDER — SODIUM CHLORIDE 0.9 % IR SOLN
Status: DC | PRN
  Administered 2019-12-16: 3000 mL

## 2019-12-16 MED ORDER — LIDOCAINE HCL 1 % IJ SOLN
INTRAMUSCULAR | Status: DC | PRN
  Administered 2019-12-16: 10 mL

## 2019-12-16 MED ORDER — PROMETHAZINE HCL 25 MG/ML IJ SOLN
6.2500 mg | INTRAMUSCULAR | Status: DC | PRN
  Filled 2019-12-16: qty 1

## 2019-12-16 MED ORDER — PROPOFOL 10 MG/ML IV BOLUS
INTRAVENOUS | Status: DC | PRN
  Administered 2019-12-16: 50 mg via INTRAVENOUS
  Administered 2019-12-16: 150 mg via INTRAVENOUS

## 2019-12-16 SURGICAL SUPPLY — 16 items
BIPOLAR CUTTING LOOP 21FR (ELECTRODE)
CANISTER SUCT 3000ML PPV (MISCELLANEOUS) ×3 IMPLANT
CATH ROBINSON RED A/P 16FR (CATHETERS) ×3 IMPLANT
COVER WAND RF STERILE (DRAPES) ×3 IMPLANT
DILATOR CANAL MILEX (MISCELLANEOUS) IMPLANT
GLOVE BIO SURGEON STRL SZ7 (GLOVE) ×3 IMPLANT
GOWN STRL REUS W/TWL LRG LVL3 (GOWN DISPOSABLE) ×3 IMPLANT
IV NS IRRIG 3000ML ARTHROMATIC (IV SOLUTION) ×3 IMPLANT
KIT PROCEDURE FLUENT (KITS) ×3 IMPLANT
KIT TURNOVER CYSTO (KITS) ×3 IMPLANT
LOOP CUTTING BIPOLAR 21FR (ELECTRODE) IMPLANT
PACK VAGINAL MINOR WOMEN LF (CUSTOM PROCEDURE TRAY) ×3 IMPLANT
PAD OB MATERNITY 4.3X12.25 (PERSONAL CARE ITEMS) ×3 IMPLANT
PAD PREP 24X48 CUFFED NSTRL (MISCELLANEOUS) ×3 IMPLANT
TOWEL OR 17X26 10 PK STRL BLUE (TOWEL DISPOSABLE) ×4 IMPLANT
WATER STERILE IRR 500ML POUR (IV SOLUTION) ×3 IMPLANT

## 2019-12-16 NOTE — Op Note (Signed)
Pre-Operative Diagnosis: 1) Breakthrough bleeding 2) Suspected endometrial polyp Postoperative Diagnosis: 1) Breakthrough bleeding 2) Endometrial polyp Procedure: Hysteroscopy, dilation and curettage Surgeon: Dr. Waynard Reeds Assistant: None Operative Findings: Retroverted uterus, normal tubal ostial visualized bilaterally. Along posterior aspect of endometrial cavity was thickened area of endometrium c/w with the endometrial polyp visualized during office sonohystogram. Fluid deficit 276 Specimen: Endometrial curettings EBL: Total I/O In: 400 [I.V.:400] Out: 125 [Urine:100; Blood:25]   Carrie Combs Is a 34 year old gravida 2 para 2 who presents for definitive surgical management for breakthrough bleeding and suspected endometrial polyp. Please see the patient's history and physical for complete details of the history. Management options were discussed with the patient. R/B/A reviewed. Following appropriate informed consent was taken to the operating room. The patient was appropriately identified during a time out procedure. General anesthesia was administered and the patient was placed in the dorsal lithotomy position. The patient was prepped and draped in the normal sterile fashion. A speculum was placed into the vagina, a single-tooth tenaculum was placed on the anterior lip of the cervix, and 10 cc of 1% lidocaine was administered in a paracervical fashion. The cervix was serially dilated with Hank dilators. The hysteroscope was introduced with the above findings. THe hysteroscope was removed and a sharp curettage was performed. The hysteroscope was reintroduced and demonstrated a clear endometrial cavity. The hysteroscope was removed and this completed the surgical procedure. All sponge and instrument counts were correct. The patient was transferred to the PACU in stable condition following the procedure.

## 2019-12-16 NOTE — Discharge Instructions (Signed)
DISCHARGE INSTRUCTIONS: HYSTEROSCOPY The following instructions have been prepared to help you care for yourself upon your return home.  May Remove Scop patch on or before Monday January 18.  May take Ibuprofen after 7pm  May take stool softner while taking narcotic pain medication to prevent constipation.  Drink plenty of water.  Personal hygiene: Marland Kitchen Use sanitary pads for vaginal drainage, not tampons. . Shower the day after your procedure. . NO tub baths, pools or Jacuzzis for 2-3 weeks. . Wipe front to back after using the bathroom.  Activity and limitations: . Do NOT drive or operate any equipment for 24 hours. The effects of anesthesia are still present and drowsiness may result. . Do NOT rest in bed all day. . Walking is encouraged. . Walk up and down stairs slowly. . You may resume your normal activity in one to two days or as indicated by your physician. Sexual activity: NO intercourse for at least 2 weeks after the procedure, or as indicated by your Doctor.  Diet: Eat a light meal as desired this evening. You may resume your usual diet tomorrow.  Return to Work: You may resume your work activities in one to two days or as indicated by Therapist, sports.  What to expect after your surgery: Expect to have vaginal bleeding/discharge for 2-3 days and spotting for up to 10 days. It is not unusual to have soreness for up to 1-2 weeks. You may have a slight burning sensation when you urinate for the first day. Mild cramps may continue for a couple of days. You may have a regular period in 2-6 weeks.  Call your doctor for any of the following: . Excessive vaginal bleeding or clotting, saturating and changing one pad every hour. . Inability to urinate 6 hours after discharge from hospital. . Pain not relieved by pain medication. . Fever of 100.4 F or greater. . Unusual vaginal discharge or odor.  Post Anesthesia Home Care Instructions  Activity: Get plenty of rest for the  remainder of the day. A responsible individual must stay with you for 24 hours following the procedure.  For the next 24 hours, DO NOT: -Drive a car -Advertising copywriter -Drink alcoholic beverages -Take any medication unless instructed by your physician -Make any legal decisions or sign important papers.  Meals: Start with liquid foods such as gelatin or soup. Progress to regular foods as tolerated. Avoid greasy, spicy, heavy foods. If nausea and/or vomiting occur, drink only clear liquids until the nausea and/or vomiting subsides. Call your physician if vomiting continues.  Special Instructions/Symptoms: Your throat may feel dry or sore from the anesthesia or the breathing tube placed in your throat during surgery. If this causes discomfort, gargle with warm salt water. The discomfort should disappear within 24 hours.  If you had a scopolamine patch placed behind your ear for the management of post- operative nausea and/or vomiting:  1. The medication in the patch is effective for 72 hours, after which it should be removed.  Wrap patch in a tissue and discard in the trash. Wash hands thoroughly with soap and water. 2. You may remove the patch earlier than 72 hours if you experience unpleasant side effects which may include dry mouth, dizziness or visual disturbances. 3. Avoid touching the patch. Wash your hands with soap and water after contact with the patch.

## 2019-12-16 NOTE — Anesthesia Preprocedure Evaluation (Addendum)
Anesthesia Evaluation  Patient identified by MRN, date of birth, ID band Patient awake    Reviewed: Allergy & Precautions, NPO status , Patient's Chart, lab work & pertinent test results  History of Anesthesia Complications (+) PONV and history of anesthetic complications  Airway Mallampati: I  TM Distance: >3 FB Neck ROM: Full    Dental  (+) Dental Advisory Given, Teeth Intact   Pulmonary asthma (exercise induced) ,    Pulmonary exam normal        Cardiovascular negative cardio ROS Normal cardiovascular exam     Neuro/Psych  Headaches, negative psych ROS   GI/Hepatic negative GI ROS, Neg liver ROS,   Endo/Other  negative endocrine ROS  Renal/GU Renal disease (stones)     Musculoskeletal negative musculoskeletal ROS (+)   Abdominal   Peds  Hematology negative hematology ROS (+)   Anesthesia Other Findings Covid neg 1/12   Reproductive/Obstetrics                            Anesthesia Physical Anesthesia Plan  ASA: II  Anesthesia Plan: General   Post-op Pain Management:    Induction: Intravenous  PONV Risk Score and Plan: 4 or greater and Treatment may vary due to age or medical condition, Ondansetron, Dexamethasone and Midazolam  Airway Management Planned: LMA  Additional Equipment: None  Intra-op Plan:   Post-operative Plan: Extubation in OR  Informed Consent: I have reviewed the patients History and Physical, chart, labs and discussed the procedure including the risks, benefits and alternatives for the proposed anesthesia with the patient or authorized representative who has indicated his/her understanding and acceptance.     Dental advisory given  Plan Discussed with: CRNA and Anesthesiologist  Anesthesia Plan Comments:        Anesthesia Quick Evaluation

## 2019-12-16 NOTE — H&P (Signed)
Carrie Combs is an 34 y.o. female.   34 yo G2P2 presents for surgical management of an endometrial polyp that was found during an evaluation for breakthrough bleeding. Office sonohystogram showed AV uterus with endo thickness 4.84 mm. Adnexa normal appearing bilaterally. NO FF in CDS. With infusion of saline there appears to be a small polyp measuring 3 x 8 mm on the posterior wall of the endometrial cavity. Pt presents for surgical management. R/B/A reviewed with the patient and she wishes to proceed  Patient's last menstrual period was 12/02/2019.    Past Medical History:  Diagnosis Date  . Abnormal LFTs 2019   being evaluated for this is due to kidney micro stones   . Asthma    exercise-induced  . History of kidney stones    micro stones  . Migraine    monthly with menses  . Pancreatitis    at times due to microkidney stones  . PONV (postoperative nausea and vomiting)     Past Surgical History:  Procedure Laterality Date  . CHOLECYSTECTOMY  2003   laparoscopic  . EUS N/A 05/01/2016   Procedure: UPPER ENDOSCOPIC ULTRASOUND (EUS) LINEAR;  Surgeon: Milus Banister, MD;  Location: [facility];  Service: Endoscopy;  Laterality: N/A;  . KIDNEY STONE SURGERY  2009  . LITHOTRIPSY  12/2015  . WISDOM TOOTH EXTRACTION  10/2002    Family History  Problem Relation Age of Onset  . Cancer Paternal Grandfather        liver  . Alcohol abuse Paternal Grandfather   . Hyperlipidemia Mother   . Diabetes Father   . Hypertension Father   . Alcohol abuse Father   . Frontotemporal dementia Father        tentative (still undergoing work up -- 03/2017)  . Alzheimer's disease Maternal Grandmother   . Arthritis Maternal Grandmother   . Parkinson's disease Maternal Grandfather   . Stroke Paternal Grandmother   . Heart disease Paternal Grandmother   . Other Neg Hx     Social History:  reports that she has never smoked. She has never used smokeless tobacco. She reports current alcohol use. She  reports that she does not use drugs.  Allergies:  Allergies  Allergen Reactions  . Hydrocodone Nausea And Vomiting    Medications Prior to Admission  Medication Sig Dispense Refill Last Dose  . albuterol (PROVENTIL HFA;VENTOLIN HFA) 108 (90 Base) MCG/ACT inhaler Inhale 2 puffs into the lungs every 6 (six) hours as needed for wheezing or shortness of breath. 1 Inhaler 0 Past Week at Unknown time  . aspirin 81 MG chewable tablet Chew 81 mg by mouth at bedtime.    Past Week at Unknown time    Review of Systems  Blood pressure 109/81, pulse 74, temperature (!) 97.5 F (36.4 C), temperature source Oral, resp. rate 16, height 5\' 4"  (1.626 m), weight 49.2 kg, last menstrual period 12/02/2019, SpO2 100 %. Physical Exam   AOX3, nAD Abd soft Normal work of breathing    Results for orders placed or performed during the hospital encounter of 12/16/19 (from the past 24 hour(s))  Pregnancy, urine POC     Status: None   Collection Time: 12/16/19 10:56 AM  Result Value Ref Range   Preg Test, Ur NEGATIVE NEGATIVE  Comprehensive metabolic panel     Status: Abnormal   Collection Time: 12/16/19 11:15 AM  Result Value Ref Range   Sodium 138 135 - 145 mmol/L   Potassium 4.0 3.5 - 5.1 mmol/L  Chloride 102 98 - 111 mmol/L   CO2 26 22 - 32 mmol/L   Glucose, Bld 94 70 - 99 mg/dL   BUN 11 6 - 20 mg/dL   Creatinine, Ser 4.69 0.44 - 1.00 mg/dL   Calcium 9.4 8.9 - 62.9 mg/dL   Total Protein 7.8 6.5 - 8.1 g/dL   Albumin 4.8 3.5 - 5.0 g/dL   AST 13 (L) 15 - 41 U/L   ALT 15 0 - 44 U/L   Alkaline Phosphatase 69 38 - 126 U/L   Total Bilirubin 0.9 0.3 - 1.2 mg/dL   GFR calc non Af Amer >60 >60 mL/min   GFR calc Af Amer >60 >60 mL/min   Anion gap 10 5 - 15  CBC     Status: None   Collection Time: 12/16/19 11:15 AM  Result Value Ref Range   WBC 7.2 4.0 - 10.5 K/uL   RBC 4.62 3.87 - 5.11 MIL/uL   Hemoglobin 13.9 12.0 - 15.0 g/dL   HCT 52.8 41.3 - 24.4 %   MCV 93.1 80.0 - 100.0 fL   MCH 30.1 26.0  - 34.0 pg   MCHC 32.3 30.0 - 36.0 g/dL   RDW 01.0 27.2 - 53.6 %   Platelets 301 150 - 400 K/uL   nRBC 0.0 0.0 - 0.2 %    No results found.  Assessment/Plan: 1) Consent for hysteroscopy, D&C 2) SCDs OCTOR   Waynard Reeds 12/16/2019, 12:19 PM

## 2019-12-16 NOTE — Transfer of Care (Signed)
Immediate Anesthesia Transfer of Care Note  Patient: Carrie Combs  Procedure(s) Performed: DILATATION AND CURETTAGE /HYSTEROSCOPY (N/A Vagina ) CERVICAL POLYPECTOMY (N/A Vagina )  Patient Location: PACU  Anesthesia Type:General  Level of Consciousness: awake, alert  and oriented  Airway & Oxygen Therapy: Patient Spontanous Breathing and Patient connected to nasal cannula oxygen  Post-op Assessment: Report given to RN and Post -op Vital signs reviewed and stable  Post vital signs: Reviewed and stable  Last Vitals:  Vitals Value Taken Time  BP 115/87 12/16/19 1317  Temp 36.9 C 12/16/19 1315  Pulse 104 12/16/19 1319  Resp 12 12/16/19 1319  SpO2 100 % 12/16/19 1319  Vitals shown include unvalidated device data.  Last Pain:  Vitals:   12/16/19 1133  TempSrc: Oral  PainSc: 2       Patients Stated Pain Goal: 3 (12/16/19 1133)  Complications: No apparent anesthesia complications

## 2019-12-16 NOTE — Anesthesia Procedure Notes (Signed)
Procedure Name: LMA Insertion Date/Time: 12/16/2019 12:34 PM Performed by: Cleda Clarks, CRNA Pre-anesthesia Checklist: Patient identified, Emergency Drugs available, Suction available and Patient being monitored Patient Re-evaluated:Patient Re-evaluated prior to induction Oxygen Delivery Method: Circle system utilized Preoxygenation: Pre-oxygenation with 100% oxygen Induction Type: IV induction Ventilation: Mask ventilation without difficulty LMA: LMA inserted LMA Size: 3.0 Number of attempts: 1 Airway Equipment and Method: Bite block Placement Confirmation: positive ETCO2 Tube secured with: Tape Dental Injury: Teeth and Oropharynx as per pre-operative assessment

## 2019-12-16 NOTE — Anesthesia Postprocedure Evaluation (Signed)
Anesthesia Post Note  Patient: Carrie Combs  Procedure(s) Performed: DILATATION AND CURETTAGE /HYSTEROSCOPY (N/A Vagina ) CERVICAL POLYPECTOMY (N/A Vagina )     Patient location during evaluation: PACU Anesthesia Type: General Level of consciousness: awake and alert Pain management: pain level controlled Vital Signs Assessment: post-procedure vital signs reviewed and stable Respiratory status: spontaneous breathing, nonlabored ventilation and respiratory function stable Cardiovascular status: blood pressure returned to baseline and stable Postop Assessment: no apparent nausea or vomiting Anesthetic complications: no    Last Vitals:  Vitals:   12/16/19 1345 12/16/19 1400  BP: 110/74 114/70  Pulse: 85 80  Resp: 13 11  Temp:  37.1 C  SpO2: 100% 100%    Last Pain:  Vitals:   12/16/19 1345  TempSrc:   PainSc: 0-No pain                 Beryle Lathe

## 2019-12-19 LAB — SURGICAL PATHOLOGY

## 2020-02-29 ENCOUNTER — Ambulatory Visit (INDEPENDENT_AMBULATORY_CARE_PROVIDER_SITE_OTHER): Payer: Managed Care, Other (non HMO) | Admitting: Family

## 2020-02-29 ENCOUNTER — Other Ambulatory Visit: Payer: Self-pay

## 2020-02-29 ENCOUNTER — Encounter: Payer: Self-pay | Admitting: Family

## 2020-02-29 VITALS — BP 104/81 | HR 89 | Temp 97.7°F | Resp 16 | Ht 64.0 in | Wt 106.0 lb

## 2020-02-29 DIAGNOSIS — Z0000 Encounter for general adult medical examination without abnormal findings: Secondary | ICD-10-CM

## 2020-02-29 LAB — CBC WITH DIFFERENTIAL/PLATELET
Basophils Absolute: 0 10*3/uL (ref 0.0–0.1)
Basophils Relative: 0.7 % (ref 0.0–3.0)
Eosinophils Absolute: 0.4 10*3/uL (ref 0.0–0.7)
Eosinophils Relative: 5.8 % — ABNORMAL HIGH (ref 0.0–5.0)
HCT: 39.5 % (ref 36.0–46.0)
Hemoglobin: 13.4 g/dL (ref 12.0–15.0)
Lymphocytes Relative: 33.8 % (ref 12.0–46.0)
Lymphs Abs: 2.1 10*3/uL (ref 0.7–4.0)
MCHC: 33.9 g/dL (ref 30.0–36.0)
MCV: 90.3 fl (ref 78.0–100.0)
Monocytes Absolute: 0.4 10*3/uL (ref 0.1–1.0)
Monocytes Relative: 6.3 % (ref 3.0–12.0)
Neutro Abs: 3.3 10*3/uL (ref 1.4–7.7)
Neutrophils Relative %: 53.4 % (ref 43.0–77.0)
Platelets: 255 10*3/uL (ref 150.0–400.0)
RBC: 4.37 Mil/uL (ref 3.87–5.11)
RDW: 12.4 % (ref 11.5–15.5)
WBC: 6.1 10*3/uL (ref 4.0–10.5)

## 2020-02-29 LAB — LIPID PANEL
Cholesterol: 186 mg/dL (ref 0–200)
HDL: 57 mg/dL (ref >39.00)
LDL Cholesterol: 119 mg/dL — ABNORMAL HIGH (ref 0–99)
NonHDL: 128.54
Total CHOL/HDL Ratio: 3
Triglycerides: 46 mg/dL (ref 0.0–149.0)
VLDL: 9.2 mg/dL (ref 0.0–40.0)

## 2020-02-29 LAB — BASIC METABOLIC PANEL
BUN: 12 mg/dL (ref 6–23)
CO2: 27 mEq/L (ref 19–32)
Calcium: 9.7 mg/dL (ref 8.4–10.5)
Chloride: 102 mEq/L (ref 96–112)
Creatinine, Ser: 0.54 mg/dL (ref 0.40–1.20)
GFR: 127.9 mL/min (ref >60.00)
Glucose, Bld: 83 mg/dL (ref 70–99)
Potassium: 4.4 mEq/L (ref 3.5–5.1)
Sodium: 135 mEq/L (ref 135–145)

## 2020-02-29 LAB — HEPATIC FUNCTION PANEL
ALT: 13 U/L (ref 0–35)
AST: 13 U/L (ref 0–37)
Albumin: 4.7 g/dL (ref 3.5–5.2)
Alkaline Phosphatase: 78 U/L (ref 39–117)
Bilirubin, Direct: 0.1 mg/dL (ref 0.0–0.3)
Total Bilirubin: 0.5 mg/dL (ref 0.2–1.2)
Total Protein: 7 g/dL (ref 6.0–8.3)

## 2020-02-29 LAB — TSH: TSH: 1.59 u[IU]/mL (ref 0.35–4.50)

## 2020-02-29 MED ORDER — ASPIRIN EC 81 MG PO TBEC: 81.0000 mg | DELAYED_RELEASE_TABLET | Freq: Every day | ORAL | Status: AC

## 2020-02-29 NOTE — Progress Notes (Signed)
Subjective:    Patient ID: Carrie Combs, female    DOB: November 12, 1986, 34 y.o.   MRN: [id number]  HPI  Patient presents today for complete physical.  Immunizations: tdap 2013 Diet: reports that her diet has been healthy Exercise: enjoys spin biking, and strength training.  Pap Smear:Dr. Waynard Reeds (09/01/2019) Dental:  Up to date Vision: plans to schedule Wt Readings from Last 3 Encounters:  02/29/20 106 lb (48.1 kg)  12/16/19 108 lb 8 oz (49.2 kg)  09/24/18 111 lb (50.3 kg)    ? Microlithiasis vs. ? Sphincter of Oddy dysfunction- reports that once every 9 months gets sharp abdominal pain. She is taking a baby aspirin once a day and this is helpful.  (She is seeing Dr. Jason Fila- GI). Had her gallbladder out at age 14. No current symptoms.   Review of Systems  Constitutional: Negative for unexpected weight change.  HENT: Negative for hearing loss and rhinorrhea.   Eyes: Negative for visual disturbance.  Respiratory: Negative for cough and shortness of breath.   Cardiovascular: Negative for chest pain.  Gastrointestinal: Negative for blood in stool, constipation and diarrhea.  Genitourinary: Negative for dysuria, frequency and hematuria.  Musculoskeletal: Negative for arthralgias and myalgias.  Skin: Negative for rash.  Neurological: Positive for headaches (mild HA's recently due to weather change).  Psychiatric/Behavioral:       Denies depression/anxiety   Past Medical History:  Diagnosis Date  . Abnormal LFTs 2019   being evaluated for this is due to kidney micro stones   . Asthma    exercise-induced  . History of kidney stones    micro stones  . Migraine    monthly with menses  . Pancreatitis    at times due to microkidney stones  . PONV (postoperative nausea and vomiting)      Social History   Socioeconomic History  . Marital status: Married    Spouse name: Not on file  . Number of children: Not on file  . Years of education: Not on file  . Highest education  level: Not on file  Occupational History  . Not on file  Tobacco Use  . Smoking status: Never Smoker  . Smokeless tobacco: Never Used  Substance and Sexual Activity  . Alcohol use: Yes    Alcohol/week: 0.0 standard drinks    Comment: occasional  . Drug use: No  . Sexual activity: Yes  Other Topics Concern  . 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    Completed college   Enjoys spending time with friends, reading.    Completed college   Social Determinants of Health   Financial Resource Strain:   . Difficulty of Paying Living Expenses:   Food Insecurity:   . Worried About Programme researcher, broadcasting/film/video in the Last Year:   . Barista in the Last Year:   Transportation Needs:   . Freight forwarder (Medical):   Marland Kitchen Lack of Transportation (Non-Medical):   Physical Activity:   . Days of Exercise per Week:   . Minutes of Exercise per Session:   Stress:   . Feeling of Stress :   Social Connections:   . Frequency of Communication with Friends and Family:   . Frequency of Social Gatherings with Friends and Family:   . Attends Religious Services:   . Active Member of Clubs or Organizations:   .  Attends Banker Meetings:   Marland Kitchen Marital Status:   Intimate Partner Violence:   . Fear of Current or Ex-Partner:   . Emotionally Abused:   Marland Kitchen Physically Abused:   . Sexually Abused:     Past Surgical History:  Procedure Laterality Date  . CERVICAL POLYPECTOMY N/A 12/16/2019   Procedure: CERVICAL POLYPECTOMY;  Surgeon: Waynard Reeds, MD;  Location: [facility];  Service: Gynecology;  Laterality: N/A;  . CHOLECYSTECTOMY  2003   laparoscopic  . EUS N/A 05/01/2016   Procedure: UPPER ENDOSCOPIC ULTRASOUND (EUS) LINEAR;  Surgeon: Rachael Fee, MD;  Location: [facility];  Service: Endoscopy;  Laterality: N/A;  . HYSTEROSCOPY WITH D & C N/A 12/16/2019   Procedure:  DILATATION AND CURETTAGE /HYSTEROSCOPY;  Surgeon: Waynard Reeds, MD;  Location: [facility];  Service: Gynecology;  Laterality: N/A;  . KIDNEY STONE SURGERY  2009  . LITHOTRIPSY  12/2015  . WISDOM TOOTH EXTRACTION  10/2002    Family History  Problem Relation Age of Onset  . Cancer Paternal Grandfather        liver  . Alcohol abuse Paternal Grandfather   . Hyperlipidemia Mother   . Diabetes Father   . Hypertension Father   . Alcohol abuse Father   . Frontotemporal dementia Father        tentative (still undergoing work up -- 03/2017)  . Alzheimer's disease Maternal Grandmother   . Arthritis Maternal Grandmother   . Parkinson's disease Maternal Grandfather   . Stroke Paternal Grandmother   . Heart disease Paternal Grandmother   . Other Neg Hx     Allergies  Allergen Reactions  . Hydrocodone Nausea And Vomiting    Current Outpatient Medications on File Prior to Visit  Medication Sig Dispense Refill  . ibuprofen (ADVIL) 600 MG tablet Take 1 tablet (600 mg total) by mouth every 6 (six) hours as needed. 90 tablet 0   No current facility-administered medications on file prior to visit.    BP 104/81 (BP Location: Right Arm, Patient Position: Sitting, Cuff Size: Small)   Pulse 89   Temp 97.7 F (36.5 C) (Temporal)   Resp 16   Ht 5\' 4"  (1.626 m)   Wt 106 lb (48.1 kg)   SpO2 100%   BMI 18.19 kg/m       Objective:   Physical Exam  Physical Exam  Constitutional: She is oriented to person, place, and time. She appears well-developed and well-nourished. No distress.  HENT:  Head: Normocephalic and atraumatic.  Right Ear: Tympanic membrane and ear canal normal.  Left Ear: Tympanic membrane and ear canal normal.  Mouth/Throat: Not examined- pt wearing mask Eyes: Pupils are equal, round, and reactive to light. No scleral icterus.  Neck: Normal range of motion. No thyromegaly present.  Cardiovascular: Normal rate and regular rhythm.   No murmur  heard. Pulmonary/Chest: Effort normal and breath sounds normal. No respiratory distress. He has no wheezes. She has no rales. She exhibits no tenderness.  Abdominal: Soft. Bowel sounds are normal. She exhibits no distension and no mass. There is no tenderness. There is no rebound and no guarding.  Musculoskeletal: She exhibits no edema.  Lymphadenopathy:    She has no cervical adenopathy.  Neurological: She is alert and oriented to person, place, and time. She has normal patellar reflexes. She exhibits normal muscle tone. Coordination normal.  Skin: Skin is warm and dry.  Psychiatric: She has a normal mood and affect. Her behavior is normal. Judgment  and thought content normal.  Breast/pelvic: deferred       Assessment & Plan:  Preventative care- her weight is down a few pounds. She attributes this to losing her dad 1 month ago. States that she believes that her weight is now stable. Immunizations reviewed and up to date.  This visit occurred during the SARS-CoV-2 public health emergency.  Safety protocols were in place, including screening questions prior to the visit, additional usage of staff PPE, and extensive cleaning of exam room while observing appropriate contact time as indicated for disinfecting solutions.          Assessment & Plan:

## 2020-02-29 NOTE — Patient Instructions (Signed)
Please complete lab work prior to leaving. Continue healthy diet and regular exercise.    Preventive Care 58-36 Years Old, Female Preventive care refers to visits with your health care provider and lifestyle choices that can promote health and wellness. This includes:  A yearly physical exam. This may also be called an annual well check.  Regular dental visits and eye exams.  Immunizations.  Screening for certain conditions.  Healthy lifestyle choices, such as eating a healthy diet, getting regular exercise, not using drugs or products that contain nicotine and tobacco, and limiting alcohol use. What can I expect for my preventive care visit? Physical exam Your health care provider will check your:  Height and weight. This may be used to calculate body mass index (BMI), which tells if you are at a healthy weight.  Heart rate and blood pressure.  Skin for abnormal spots. Counseling Your health care provider may ask you questions about your:  Alcohol, tobacco, and drug use.  Emotional well-being.  Home and relationship well-being.  Sexual activity.  Eating habits.  Work and work Statistician.  Method of birth control.  Menstrual cycle.  Pregnancy history. What immunizations do I need?  Influenza (flu) vaccine  This is recommended every year. Tetanus, diphtheria, and pertussis (Tdap) vaccine  You may need a Td booster every 10 years. Varicella (chickenpox) vaccine  You may need this if you have not been vaccinated. Human papillomavirus (HPV) vaccine  If recommended by your health care provider, you may need three doses over 6 months. Measles, mumps, and rubella (MMR) vaccine  You may need at least one dose of MMR. You may also need a second dose. Meningococcal conjugate (MenACWY) vaccine  One dose is recommended if you are age 62-21 years and a first-year college student living in a residence hall, or if you have one of several medical conditions. You may  also need additional booster doses. Pneumococcal conjugate (PCV13) vaccine  You may need this if you have certain conditions and were not previously vaccinated. Pneumococcal polysaccharide (PPSV23) vaccine  You may need one or two doses if you smoke cigarettes or if you have certain conditions. Hepatitis A vaccine  You may need this if you have certain conditions or if you travel or work in places where you may be exposed to hepatitis A. Hepatitis B vaccine  You may need this if you have certain conditions or if you travel or work in places where you may be exposed to hepatitis B. Haemophilus influenzae type b (Hib) vaccine  You may need this if you have certain conditions. You may receive vaccines as individual doses or as more than one vaccine together in one shot (combination vaccines). Talk with your health care provider about the risks and benefits of combination vaccines. What tests do I need?  Blood tests  Lipid and cholesterol levels. These may be checked every 5 years starting at age 69.  Hepatitis C test.  Hepatitis B test. Screening  Diabetes screening. This is done by checking your blood sugar (glucose) after you have not eaten for a while (fasting).  Sexually transmitted disease (STD) testing.  BRCA-related cancer screening. This may be done if you have a family history of breast, ovarian, tubal, or peritoneal cancers.  Pelvic exam and Pap test. This may be done every 3 years starting at age 70. Starting at age 36, this may be done every 5 years if you have a Pap test in combination with an HPV test. Talk with your health  care provider about your test results, treatment options, and if necessary, the need for more tests. Follow these instructions at home: Eating and drinking   Eat a diet that includes fresh fruits and vegetables, whole grains, lean protein, and low-fat dairy.  Take vitamin and mineral supplements as recommended by your health care  provider.  Do not drink alcohol if: ? Your health care provider tells you not to drink. ? You are pregnant, may be pregnant, or are planning to become pregnant.  If you drink alcohol: ? Limit how much you have to 0-1 drink a day. ? Be aware of how much alcohol is in your drink. In the [place] one drink equals one 12 oz bottle of beer (355 mL), one 5 oz glass of wine (148 mL), or one 1 oz glass of hard liquor (44 mL). Lifestyle  Take daily care of your teeth and gums.  Stay active. Exercise for at least 30 minutes on 5 or more days each week.  Do not use any products that contain nicotine or tobacco, such as cigarettes, e-cigarettes, and chewing tobacco. If you need help quitting, ask your health care provider.  If you are sexually active, practice safe sex. Use a condom or other form of birth control (contraception) in order to prevent pregnancy and STIs (sexually transmitted infections). If you plan to become pregnant, see your health care provider for a preconception visit. What's next?  Visit your health care provider once a year for a well check visit.  Ask your health care provider how often you should have your eyes and teeth checked.  Stay up to date on all vaccines. This information is not intended to replace advice given to you by your health care provider. Make sure you discuss any questions you have with your health care provider. Document Revised: 07/29/2018 Document Reviewed: 07/29/2018 Elsevier Patient Education  2020 [employer].

## 2020-03-30 ENCOUNTER — Encounter: Payer: Self-pay | Admitting: Family

## 2021-02-12 ENCOUNTER — Ambulatory Visit (INDEPENDENT_AMBULATORY_CARE_PROVIDER_SITE_OTHER): Payer: Managed Care, Other (non HMO) | Admitting: Family

## 2021-02-12 ENCOUNTER — Other Ambulatory Visit: Payer: Self-pay

## 2021-02-12 ENCOUNTER — Encounter: Payer: Self-pay | Admitting: Family

## 2021-02-12 VITALS — BP 116/75 | HR 77 | Temp 98.3°F | Resp 16 | Ht 64.0 in | Wt 107.0 lb

## 2021-02-12 DIAGNOSIS — Z0000 Encounter for general adult medical examination without abnormal findings: Secondary | ICD-10-CM | POA: Diagnosis not present

## 2021-02-12 DIAGNOSIS — R5383 Other fatigue: Secondary | ICD-10-CM

## 2021-02-12 NOTE — Patient Instructions (Signed)
Increase water to at least 64 oz a day. Try to eat lots of fresh fruits/veggies. For constipation- you may use miralax as needed. Try to get a little more sleep as you are able.

## 2021-02-12 NOTE — Progress Notes (Signed)
Subjective:    Patient ID: Carrie Combs, female    DOB: June 12, 1986, 35 y.o.   MRN: [id number]  HPI  Patient presents today for complete physical.  Immunizations: declines flu shot, had moderna x 3  Diet: healthy Exercise: enjoys spinning on her nordic track bike Wt Readings from Last 3 Encounters:  02/12/21 107 lb (48.5 kg)  02/29/20 106 lb (48.1 kg)  12/16/19 108 lb 8 oz (49.2 kg)  Pap Smear: 09/01/19 Dental: up to date Vision: due     Review of Systems  Constitutional: Positive for fatigue.  HENT: Negative for hearing loss and rhinorrhea.   Eyes: Negative for visual disturbance.  Respiratory: Negative for cough and shortness of breath.   Cardiovascular: Negative for chest pain.  Gastrointestinal: Positive for constipation (exercise helps). Negative for diarrhea.  Genitourinary: Positive for menstrual problem (have gotten heavier- seeing GYN). Negative for dysuria.  Musculoskeletal: Negative for arthralgias and myalgias.  Skin: Negative for rash.  Neurological: Positive for headaches (occasional).  Hematological: Bruises/bleeds easily.  Psychiatric/Behavioral:       Denies depression/anxiety   Past Medical History:  Diagnosis Date  . Abnormal LFTs 2019   being evaluated for this is due to micro galll stones   . Asthma    exercise-induced  . History of kidney stones    micro stones  . Migraine    monthly with menses  . Pancreatitis    at times due to micro gall stones  . PONV (postoperative nausea and vomiting)      Social History   Socioeconomic History  . Marital status: Married    Spouse name: Not on file  . Number of children: Not on file  . Years of education: Not on file  . Highest education level: Not on file  Occupational History  . Not on file  Tobacco Use  . Smoking status: Never Smoker  . Smokeless tobacco: Never Used  Vaping Use  . Vaping Use: Never used  Substance and Sexual Activity  . Alcohol use: Yes    Alcohol/week: 0.0 standard  drinks    Comment: occasional  . Drug use: No  . Sexual activity: Yes  Other Topics Concern  . 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    Completed college   Enjoys spending time with friends, reading.    Completed college   Social Determinants of Health   Financial Resource Strain: Not on file  Food Insecurity: Not on file  Transportation Needs: Not on file  Physical Activity: Not on file  Stress: Not on file  Social Connections: Not on file  Intimate Partner Violence: Not on file    Past Surgical History:  Procedure Laterality Date  . CERVICAL POLYPECTOMY N/A 12/16/2019   Procedure: CERVICAL POLYPECTOMY;  Surgeon: Waynard Reeds, MD;  Location: [facility];  Service: Gynecology;  Laterality: N/A;  . CHOLECYSTECTOMY  2003   laparoscopic  . EUS N/A 05/01/2016   Procedure: UPPER ENDOSCOPIC ULTRASOUND (EUS) LINEAR;  Surgeon: Rachael Fee, MD;  Location: [facility];  Service: Endoscopy;  Laterality: N/A;  . HYSTEROSCOPY WITH D & C N/A 12/16/2019   Procedure: DILATATION AND CURETTAGE /HYSTEROSCOPY;  Surgeon: Waynard Reeds, MD;  Location: [facility];  Service: Gynecology;  Laterality: N/A;  . KIDNEY STONE SURGERY  2009  . LITHOTRIPSY  12/2015  . WISDOM TOOTH EXTRACTION  10/2002    Family History  Problem Relation Age of Onset  . Cancer Paternal Grandfather        liver  . Alcohol abuse Paternal Grandfather   . Hyperlipidemia Mother   . Diabetes Father   . Hypertension Father   . Alcohol abuse Father   . Frontotemporal dementia Father        tentative (still undergoing work up -- 03/2017)  . Alzheimer's disease Maternal Grandmother   . Arthritis Maternal Grandmother   . Parkinson's disease Maternal Grandfather   . Stroke Paternal Grandmother   . Heart disease Paternal Grandmother   . Other Neg Hx     Allergies  Allergen Reactions  . Hydrocodone  Nausea And Vomiting    Current Outpatient Medications on File Prior to Visit  Medication Sig Dispense Refill  . aspirin EC 81 MG tablet Take 1 tablet (81 mg total) by mouth daily.    Marland Kitchen ibuprofen (ADVIL) 600 MG tablet Take 1 tablet (600 mg total) by mouth every 6 (six) hours as needed. 90 tablet 0   No current facility-administered medications on file prior to visit.    BP 116/75 (BP Location: Right Arm, Patient Position: Sitting, Cuff Size: Small)   Pulse 77   Temp 98.3 F (36.8 C) (Oral)   Resp 16   Ht 5\' 4"  (1.626 m)   Wt 107 lb (48.5 kg)   SpO2 100%   BMI 18.37 kg/m       Objective:   Physical Exam Vitals reviewed.  Constitutional:      Appearance: Normal appearance. She is well-developed.  HENT:     Head: Normocephalic and atraumatic.     Right Ear: Tympanic membrane and ear canal normal.     Left Ear: Tympanic membrane and ear canal normal.  Cardiovascular:     Rate and Rhythm: Normal rate and regular rhythm.     Heart sounds: Normal heart sounds. No murmur heard.   Pulmonary:     Effort: Pulmonary effort is normal. No respiratory distress.     Breath sounds: Normal breath sounds. No wheezing.  Abdominal:     General: There is no distension.     Palpations: Abdomen is soft.     Tenderness: There is no abdominal tenderness.  Musculoskeletal:        General: No swelling.  Skin:    General: Skin is warm and dry.  Neurological:     Mental Status: She is alert.  Psychiatric:        Behavior: Behavior normal.        Thought Content: Thought content normal.        Judgment: Judgment normal.           Assessment & Plan:  Preventative care-declines flu shot. Encouraged pt to continue healthy diet and regular exercise. We discussed trying to increase her sleep by 1 hr each night as able.  She currently sleeps 7 hrs a night and has a lot of fatigue during the day. I will also check baseline labs (CBC, b12, TSH) to evaluate for underlying medical cause for her  fatigue. Pap is up to date. Encouraged pt to schedule a routine eye exam. In regards to her constipation complaints- advised pt as follows:   Increase water to at least 64 oz a day. Try to eat lots of fresh fruits/veggies. For constipation- you may use miralax as needed.  This visit occurred during the SARS-CoV-2 public health emergency.  Safety protocols were in place, including  screening questions prior to the visit, additional usage of staff PPE, and extensive cleaning of exam room while observing appropriate contact time as indicated for disinfecting solutions.

## 2021-02-13 LAB — COMPREHENSIVE METABOLIC PANEL
ALT: 11 U/L (ref 0–35)
AST: 12 U/L (ref 0–37)
Albumin: 4.5 g/dL (ref 3.5–5.2)
Alkaline Phosphatase: 66 U/L (ref 39–117)
BUN: 10 mg/dL (ref 6–23)
CO2: 30 mEq/L (ref 19–32)
Calcium: 9.8 mg/dL (ref 8.4–10.5)
Chloride: 101 mEq/L (ref 96–112)
Creatinine, Ser: 0.6 mg/dL (ref 0.40–1.20)
GFR: 115.22 mL/min (ref >60.00)
Glucose, Bld: 82 mg/dL (ref 70–99)
Potassium: 4.2 mEq/L (ref 3.5–5.1)
Sodium: 138 mEq/L (ref 135–145)
Total Bilirubin: 0.5 mg/dL (ref 0.2–1.2)
Total Protein: 7.2 g/dL (ref 6.0–8.3)

## 2021-02-13 LAB — CBC WITH DIFFERENTIAL/PLATELET
Basophils Absolute: 0.1 10*3/uL (ref 0.0–0.1)
Basophils Relative: 0.8 % (ref 0.0–3.0)
Eosinophils Absolute: 0.2 10*3/uL (ref 0.0–0.7)
Eosinophils Relative: 2.8 % (ref 0.0–5.0)
HCT: 39.8 % (ref 36.0–46.0)
Hemoglobin: 13.4 g/dL (ref 12.0–15.0)
Lymphocytes Relative: 35.6 % (ref 12.0–46.0)
Lymphs Abs: 2.7 10*3/uL (ref 0.7–4.0)
MCHC: 33.7 g/dL (ref 30.0–36.0)
MCV: 89.8 fl (ref 78.0–100.0)
Monocytes Absolute: 0.5 10*3/uL (ref 0.1–1.0)
Monocytes Relative: 6.2 % (ref 3.0–12.0)
Neutro Abs: 4.2 10*3/uL (ref 1.4–7.7)
Neutrophils Relative %: 54.6 % (ref 43.0–77.0)
Platelets: 271 10*3/uL (ref 150.0–400.0)
RBC: 4.44 Mil/uL (ref 3.87–5.11)
RDW: 12.7 % (ref 11.5–15.5)
WBC: 7.7 10*3/uL (ref 4.0–10.5)

## 2021-02-13 LAB — LIPID PANEL
Cholesterol: 190 mg/dL (ref 0–200)
HDL: 62.3 mg/dL (ref >39.00)
LDL Cholesterol: 113 mg/dL — ABNORMAL HIGH (ref 0–99)
NonHDL: 127.72
Total CHOL/HDL Ratio: 3
Triglycerides: 72 mg/dL (ref 0.0–149.0)
VLDL: 14.4 mg/dL (ref 0.0–40.0)

## 2021-02-13 LAB — VITAMIN B12: Vitamin B-12: 411 pg/mL (ref 211–911)

## 2021-02-13 LAB — TSH: TSH: 1.57 u[IU]/mL (ref 0.35–4.50)

## 2021-02-18 ENCOUNTER — Encounter: Payer: Self-pay | Admitting: Family

## 2021-09-19 ENCOUNTER — Emergency Department (HOSPITAL_BASED_OUTPATIENT_CLINIC_OR_DEPARTMENT_OTHER)
Admission: EM | Admit: 2021-09-19 | Discharge: 2021-09-19 | Disposition: A | Discharge status: Home / Self Care | Payer: Managed Care, Other (non HMO) | Attending: Emergency Medicine | Admitting: Emergency Medicine

## 2021-09-19 ENCOUNTER — Encounter (HOSPITAL_BASED_OUTPATIENT_CLINIC_OR_DEPARTMENT_OTHER): Payer: Self-pay

## 2021-09-19 ENCOUNTER — Other Ambulatory Visit: Payer: Self-pay

## 2021-09-19 ENCOUNTER — Emergency Department (HOSPITAL_BASED_OUTPATIENT_CLINIC_OR_DEPARTMENT_OTHER): Payer: Managed Care, Other (non HMO)

## 2021-09-19 DIAGNOSIS — W268XXA Contact with other sharp object(s), not elsewhere classified, initial encounter: Secondary | ICD-10-CM | POA: Insufficient documentation

## 2021-09-19 DIAGNOSIS — Y9301 Activity, walking, marching and hiking: Secondary | ICD-10-CM | POA: Insufficient documentation

## 2021-09-19 DIAGNOSIS — T1490XA Injury, unspecified, initial encounter: Secondary | ICD-10-CM

## 2021-09-19 DIAGNOSIS — S63637A Sprain of interphalangeal joint of left little finger, initial encounter: Secondary | ICD-10-CM | POA: Insufficient documentation

## 2021-09-19 DIAGNOSIS — J45909 Unspecified asthma, uncomplicated: Secondary | ICD-10-CM | POA: Insufficient documentation

## 2021-09-19 DIAGNOSIS — Z7982 Long term (current) use of aspirin: Secondary | ICD-10-CM | POA: Diagnosis not present

## 2021-09-19 DIAGNOSIS — S6992XA Unspecified injury of left wrist, hand and finger(s), initial encounter: Secondary | ICD-10-CM | POA: Diagnosis present

## 2021-09-19 DIAGNOSIS — S61412A Laceration without foreign body of left hand, initial encounter: Secondary | ICD-10-CM | POA: Insufficient documentation

## 2021-09-19 NOTE — ED Notes (Signed)
ED Provider at bedside. 

## 2021-09-19 NOTE — ED Triage Notes (Signed)
Pt injured left pinky finger ~1hour PTA-skin tear/bruising noted-NAD-steady gait

## 2021-09-19 NOTE — ED Provider Notes (Signed)
[facility] [place] EMERGENCY DEPARTMENT Provider Note   CSN: [id number] Arrival date & time: 09/19/21  1922     History Chief Complaint  Patient presents with   Finger Injury    Carrie Combs is a 35 y.o. female.  HPI Patient presents with left hand/finger injury.  States she cut her left little finger on a [place] door when she was walking by.  States it bent back and she felt a pop.  Pain and swelling in the finger and hand.  No other injury.  States there is some bleeding from between the fingers.  Tetanus is within the last 8 years.    Past Medical History:  Diagnosis Date   Abnormal LFTs 2019   being evaluated for this is due to micro galll stones    Asthma    exercise-induced   History of kidney stones    micro stones   Migraine    monthly with menses   Pancreatitis    at times due to micro gall stones   PONV (postoperative nausea and vomiting)     Patient Active Problem List   Diagnosis Date Noted   Preventative health care 03/09/2016   Nephrolithiasis 01/01/2016   Hx of migraines 01/01/2016   Exercise-induced asthma 01/01/2016    Past Surgical History:  Procedure Laterality Date   CERVICAL POLYPECTOMY N/A 12/16/2019   Procedure: CERVICAL POLYPECTOMY;  Surgeon: Waynard Reeds, MD;  Location: [facility];  Service: Gynecology;  Laterality: N/A;   CHOLECYSTECTOMY  2003   laparoscopic   EUS N/A 05/01/2016   Procedure: UPPER ENDOSCOPIC ULTRASOUND (EUS) LINEAR;  Surgeon: Rachael Fee, MD;  Location: [facility];  Service: Endoscopy;  Laterality: N/A;   HYSTEROSCOPY WITH D & C N/A 12/16/2019   Procedure: DILATATION AND CURETTAGE /HYSTEROSCOPY;  Surgeon: Waynard Reeds, MD;  Location: [facility] [place];  Service: Gynecology;  Laterality: N/A;   KIDNEY STONE SURGERY  2009   LITHOTRIPSY  12/2015   WISDOM TOOTH EXTRACTION  10/2002     OB History     Gravida  2   Para  2   Term  2   Preterm  0   AB  0   Living  2      SAB  0    IAB  0   Ectopic  0   Multiple  0   Live Births  2           Family History  Problem Relation Age of Onset   Cancer Paternal Grandfather        liver   Alcohol abuse Paternal Grandfather    Hyperlipidemia Mother    Diabetes Father    Hypertension Father    Alcohol abuse Father    Frontotemporal dementia Father        tentative (still undergoing work up -- 03/2017)   Alzheimer's disease Maternal Grandmother    Arthritis Maternal Grandmother    Parkinson's disease Maternal Grandfather    Stroke Paternal Grandmother    Heart disease Paternal Grandmother    Migraines Sister    Obesity Half-Sister    Obesity Half-Sister    Other Neg Hx     Social History   Tobacco Use   Smoking status: Never   Smokeless tobacco: Never  Vaping Use   Vaping Use: Never used  Substance Use Topics   Alcohol use: Yes    Comment: occ   Drug use: No    Home Medications Prior to Admission  medications   Medication Sig Start Date End Date Taking? Authorizing Provider  aspirin EC 81 MG tablet Take 1 tablet (81 mg total) by mouth daily. 02/29/20   Sandford Craze, NP  ibuprofen (ADVIL) 600 MG tablet Take 1 tablet (600 mg total) by mouth every 6 (six) hours as needed. 12/16/19   Waynard Reeds, MD    Allergies    Hydrocodone  Review of Systems   Review of Systems  Constitutional:  Negative for appetite change.  Musculoskeletal:        Left hand injury.  Neurological:  Negative for weakness and numbness.   Physical Exam Updated Vital Signs BP 123/83 (BP Location: Left Arm)   Pulse 87   Temp 98.6 F (37 C) (Oral)   Resp 18   Ht 5\' 3"  (1.6 m)   Wt 52.2 kg   LMP 09/05/2021   SpO2 97%   BMI 20.37 kg/m   Physical Exam Vitals and nursing note reviewed.  Constitutional:      Appearance: Normal appearance.  Musculoskeletal:     Comments: Tenderness with some ecchymosis on the PIP joint on left finger.  Also some tenderness over MCP joint of both the fourth and fifth finger on  the left hand.  Less than 1 cm somewhat superficial laceration between the fourth and fifth finger.  Sensation distally intact.  Some tenderness over distal metacarpal on the fourth and fifth finger.  Skin:    General: Skin is warm.     Capillary Refill: Capillary refill takes less than 2 seconds.  Neurological:     Mental Status: She is alert and oriented to person, place, and time.    ED Results / Procedures / Treatments   Labs (all labs ordered are listed, but only abnormal results are displayed) Labs Reviewed - No data to display  EKG None  Radiology DG Finger Little Left  Result Date: 09/19/2021 CLINICAL DATA:  Fifth digit injury today with pain and bruising, initial encounter EXAM: LEFT LITTLE FINGER 2+V COMPARISON:  None. FINDINGS: There is no evidence of fracture or dislocation. There is no evidence of arthropathy or other focal bone abnormality. Soft tissues are unremarkable. IMPRESSION: No acute abnormality noted. Electronically Signed   By: 09/21/2021 M.D.   On: 09/19/2021 19:55    Procedures Procedures   Medications Ordered in ED Medications - No data to display  ED Course  I have reviewed the triage vital signs and the nursing notes.  Pertinent labs & imaging results that were available during my care of the patient were reviewed by me and considered in my medical decision making (see chart for details).    MDM Rules/Calculators/A&P                           Finger sprains and may be sprain of the hand.  Negative x-ray.  Does have a small laceration that somewhat superficial.  Due to location do not think it is good for Dermabond and not easily suturable.  Discussed with patient and at this point will buddy tape the fingers together.  Wants to be able to use the hand.  Follow-up as an outpatient as needed.  Tetanus up-to-date.  Discharge home Final Clinical Impression(s) / ED Diagnoses Final diagnoses:  Sprain of interphalangeal joint of left little finger,  initial encounter  Laceration of left hand without foreign body, initial encounter    Rx / DC Orders ED Discharge Orders  None        Benjiman Core, MD 09/19/21 2050

## 2021-10-08 ENCOUNTER — Other Ambulatory Visit: Payer: Self-pay

## 2021-10-08 ENCOUNTER — Ambulatory Visit (INDEPENDENT_AMBULATORY_CARE_PROVIDER_SITE_OTHER): Payer: Managed Care, Other (non HMO) | Admitting: Orthopedic Surgery

## 2021-10-08 ENCOUNTER — Encounter: Payer: Self-pay | Admitting: Orthopedic Surgery

## 2021-10-08 VITALS — BP 109/74 | HR 77 | Ht 63.0 in | Wt 115.0 lb

## 2021-10-08 DIAGNOSIS — M79642 Pain in left hand: Secondary | ICD-10-CM

## 2021-10-08 DIAGNOSIS — S6992XA Unspecified injury of left wrist, hand and finger(s), initial encounter: Secondary | ICD-10-CM

## 2021-10-08 NOTE — Progress Notes (Signed)
Office Visit Note   Patient: Carrie Combs           Date of Birth: 12/08/1985           MRN: [id number] Visit Date: 10/08/2021              Requested by: Sandford Craze, NP [address] [place],  [place] [address] PCP: Sandford Craze, NP   Assessment & Plan: Visit Diagnoses:  1. Injury of collateral ligament of finger of left hand, initial encounter     Plan: Discussed with patient that the nature of her injury and exam findings are suggestive of a injury to the radial collateral ligament of the small finger MP joint.  We will get an MRI to further evaluate the ligament.  We discussed different treatment options depending on the grade of injury including nonoperative treatment with immobilization or buddy taping versus ligament repair.  We will see her back in the office once the MRI is completed.  Follow-Up Instructions: No follow-ups on file.   Orders:  Orders Placed This Encounter  Procedures   MR HAND LEFT WO CONTRAST   No orders of the defined types were placed in this encounter.     Procedures: No procedures performed   Clinical Data: No additional findings.   Subjective: Chief Complaint  Patient presents with   Left Little Finger - Injury    DOI: 09/19/21, RIGHT handed, no N/T, + swelling and weakness and tenderness. 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  She was going to take out the dog approximately 2.5 weeks ago when he pulled the leash and her hand hit the door with what sounds like a hyperextension injury to the small finger.  She was seen in the ER where x-rays were negative.  She had significant ecchymosis and swelling of this finger.  The swelling and bruising has largely resolved.  She previously was quite stiff but now has improved range of motion and lacks 1 to 2 cm of flexion from the palm.  She still has pain on the radial  aspect of the finger at the MP joint.  This is worse when she bumps her finger on something or otherwise ulnarly deviates the digit.  She has no pain at rest.  She has been occasionally buddy taping this finger.  Injury   Review of Systems   Objective: Vital Signs: BP 109/74 (BP Location: Left Arm, Patient Position: Sitting)   Pulse 77   Ht 5\' 3"  (1.6 m)   Wt 115 lb (52.2 kg)   BMI 20.37 kg/m   Physical Exam Constitutional:      Appearance: Normal appearance.  Cardiovascular:     Rate and Rhythm: Normal rate.     Pulses: Normal pulses.  Pulmonary:     Effort: Pulmonary effort is normal.  Skin:    General: Skin is warm and dry.     Capillary Refill: Capillary refill takes less than 2 seconds.  Neurological:     Mental Status: She is alert.    Left Hand Exam   Tenderness  Left hand tenderness location: TTP at radial aspect of small finger MP joint with mild swelling.   Other  Erythema: absent Sensation: normal Pulse: present  Comments:  Near full ROM of finger but lacks approx 1 cm from distal palmar crease.  Only tender at radial aspect  of MP joint.  Laxity to ulnar deviation in 45 degrees of flexion compared to contralateral side but seemingly with end point.      Specialty Comments:  No specialty comments available.  Imaging: Previous x-rays of the left small finger taken in the ER 2.5 weeks ago reviewed by me today.  They demonstrate no acute bony injury our dislocation.    PMFS History: Patient Active Problem List   Diagnosis Date Noted   Preventative health care 03/09/2016   Nephrolithiasis 01/01/2016   Hx of migraines 01/01/2016   Exercise-induced asthma 01/01/2016   Past Medical History:  Diagnosis Date   Abnormal LFTs 2019   being evaluated for this is due to micro galll stones    Asthma    exercise-induced   History of kidney stones    micro stones   Migraine    monthly with menses   Pancreatitis    at times due to micro gall stones    PONV (postoperative nausea and vomiting)     Family History  Problem Relation Age of Onset   Cancer Paternal Grandfather        liver   Alcohol abuse Paternal Grandfather    Hyperlipidemia Mother    Diabetes Father    Hypertension Father    Alcohol abuse Father    Frontotemporal dementia Father        tentative (still undergoing work up -- 03/2017)   Alzheimer's disease Maternal Grandmother    Arthritis Maternal Grandmother    Parkinson's disease Maternal Grandfather    Stroke Paternal Grandmother    Heart disease Paternal Grandmother    Migraines Sister    Obesity Half-Sister    Obesity Half-Sister    Other Neg Hx     Past Surgical History:  Procedure Laterality Date   CERVICAL POLYPECTOMY N/A 12/16/2019   Procedure: CERVICAL POLYPECTOMY;  Surgeon: Waynard Reeds, MD;  Location: [facility] [place];  Service: Gynecology;  Laterality: N/A;   CHOLECYSTECTOMY  2003   laparoscopic   EUS N/A 05/01/2016   Procedure: UPPER ENDOSCOPIC ULTRASOUND (EUS) LINEAR;  Surgeon: Rachael Fee, MD;  Location: [facility];  Service: Endoscopy;  Laterality: N/A;   HYSTEROSCOPY WITH D & C N/A 12/16/2019   Procedure: DILATATION AND CURETTAGE /HYSTEROSCOPY;  Surgeon: Waynard Reeds, MD;  Location: [facility] [place];  Service: Gynecology;  Laterality: N/A;   KIDNEY STONE SURGERY  2009   LITHOTRIPSY  12/2015   WISDOM TOOTH EXTRACTION  10/2002   Social History   Occupational History   Not on file  Tobacco Use   Smoking status: Never   Smokeless tobacco: Never  Vaping Use   Vaping Use: Never used  Substance and Sexual Activity   Alcohol use: Yes    Comment: occ   Drug use: No   Sexual activity: Not on file

## 2021-10-31 ENCOUNTER — Ambulatory Visit (INDEPENDENT_AMBULATORY_CARE_PROVIDER_SITE_OTHER): Payer: Managed Care, Other (non HMO) | Admitting: Orthopedic Surgery

## 2021-10-31 ENCOUNTER — Other Ambulatory Visit: Payer: Self-pay

## 2021-10-31 ENCOUNTER — Encounter: Payer: Self-pay | Admitting: Orthopedic Surgery

## 2021-10-31 DIAGNOSIS — S6992XA Unspecified injury of left wrist, hand and finger(s), initial encounter: Secondary | ICD-10-CM | POA: Insufficient documentation

## 2021-10-31 NOTE — Progress Notes (Signed)
Office Visit Note   Patient: Carrie Combs           Date of Birth: [birth year]           MRN: [id number] Visit Date: 10/31/2021              Requested by: Sandford Craze, NP [address] [place],  [place] [address] PCP: Sandford Craze, NP   Assessment & Plan: Visit Diagnoses:  1. Injury of collateral ligament of finger of left hand, initial encounter     Plan: Reviewed the MRI results which demonstrated a sprain of the small finger radial collateral ligament with bony contusion.  Patient was actually doing very well after our visit but was unfortunately hit in this same finger by her dog a week or two ago.  She continues to have pain in this joint but has a firm end point w/ no pain on ulnar stress at the MP joint.  She does have significant stiffness at this MP joint.  We discussed treatment options including hand therapy and splinting.  She will see the hand therapist to have a thermoplast splint made to protect her finger as it heals especially given her recent re-injury.  She will also work on ROM of the MP joint.   Follow-Up Instructions: No follow-ups on file.   Orders:  No orders of the defined types were placed in this encounter.  No orders of the defined types were placed in this encounter.     Procedures: No procedures performed   Clinical Data: No additional findings.   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  She initially hit her left hand on a door when she was pulled down by her dog.  She was doing quite well with minimal pain when she was hit by her dog again in this same finger a week or two ago.  Her pain today is localized to the MP joint, mostly on the radial side.  This joint is quite stiff with limited flexion and abduction. An MRI was obtained which demonstrated bony edema in  the small finger MP joint with a sprain of the radial collateral ligament.  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  Her swelling has completely resolved from the second insult to this finger.    Review of Systems   Objective: Vital Signs: There were no vitals taken for this visit.  Physical Exam Constitutional:      Appearance: Normal appearance.  Cardiovascular:     Rate and Rhythm: Normal rate.     Pulses: Normal pulses.  Pulmonary:     Effort: Pulmonary effort is normal.  Skin:    General: Skin is warm and dry.     Capillary Refill: Capillary refill takes less than 2 seconds.  Neurological:     Mental Status: She is alert.    Left Hand Exam   Tenderness  Left hand tenderness location: TTP at [facility] head both dorsally and volarly.  TTP at radial and ulnar aspect of joint.  Swelling nearly resolved.   Other  Erythema: absent Sensation: normal Pulse: present  Comments:  Stiffness at small finger MP joint with only approx 45 degrees of flexion.  No pain  or laxity with radial or ulnar stress with finger flexed to 45 degrees.      Specialty Comments:  No specialty comments available.  Imaging: Outside MRI on a disc reviewed and interpreted by me.  It demonstrates bony and soft tissue edema around the small finger MP joint but no obvious tear of the collateral ligaments.    PMFS History: Patient Active Problem List   Diagnosis Date Noted   Injury of collateral ligament of finger of left hand 10/31/2021   Preventative health care 03/09/2016   Nephrolithiasis 01/01/2016   Hx of migraines 01/01/2016   Exercise-induced asthma 01/01/2016   Past Medical History:  Diagnosis Date   Abnormal LFTs 2019   being evaluated for this is due to micro galll stones    Asthma    exercise-induced   History of kidney stones    micro stones   Migraine    monthly with menses   Pancreatitis    at times due to micro gall  stones   PONV (postoperative nausea and vomiting)     Family History  Problem Relation Age of Onset   Cancer Paternal Grandfather        liver   Alcohol abuse Paternal Grandfather    Hyperlipidemia Mother    Diabetes Father    Hypertension Father    Alcohol abuse Father    Frontotemporal dementia Father        tentative (still undergoing work up -- 03/2017)   Alzheimer's disease Maternal Grandmother    Arthritis Maternal Grandmother    Parkinson's disease Maternal Grandfather    Stroke Paternal Grandmother    Heart disease Paternal Grandmother    Migraines Sister    Obesity Half-Sister    Obesity Half-Sister    Other Neg Hx     Past Surgical History:  Procedure Laterality Date   CERVICAL POLYPECTOMY N/A 12/16/2019   Procedure: CERVICAL POLYPECTOMY;  Surgeon: Waynard Reeds, MD;  Location: [facility];  Service: Gynecology;  Laterality: N/A;   CHOLECYSTECTOMY  2003   laparoscopic   EUS N/A 05/01/2016   Procedure: UPPER ENDOSCOPIC ULTRASOUND (EUS) LINEAR;  Surgeon: Rachael Fee, MD;  Location: [facility];  Service: Endoscopy;  Laterality: N/A;   HYSTEROSCOPY WITH D & C N/A 12/16/2019   Procedure: DILATATION AND CURETTAGE /HYSTEROSCOPY;  Surgeon: Waynard Reeds, MD;  Location: [facility] [place];  Service: Gynecology;  Laterality: N/A;   KIDNEY STONE SURGERY  2009   LITHOTRIPSY  12/2015   WISDOM TOOTH EXTRACTION  10/2002   Social History   Occupational History   Not on file  Tobacco Use   Smoking status: Never   Smokeless tobacco: Never  Vaping Use   Vaping Use: Never used  Substance and Sexual Activity   Alcohol use: Yes    Comment: occ   Drug use: No   Sexual activity: Not on file

## 2021-11-14 ENCOUNTER — Ambulatory Visit (INDEPENDENT_AMBULATORY_CARE_PROVIDER_SITE_OTHER): Payer: Managed Care, Other (non HMO) | Admitting: Occupational Therapy

## 2021-11-14 ENCOUNTER — Other Ambulatory Visit: Payer: Self-pay

## 2021-11-14 DIAGNOSIS — R600 Localized edema: Secondary | ICD-10-CM

## 2021-11-14 DIAGNOSIS — M79645 Pain in left finger(s): Secondary | ICD-10-CM

## 2021-11-14 DIAGNOSIS — M6281 Muscle weakness (generalized): Secondary | ICD-10-CM

## 2021-11-14 DIAGNOSIS — M79642 Pain in left hand: Secondary | ICD-10-CM | POA: Diagnosis not present

## 2021-11-14 DIAGNOSIS — R278 Other lack of coordination: Secondary | ICD-10-CM | POA: Diagnosis not present

## 2021-11-14 DIAGNOSIS — M25642 Stiffness of left hand, not elsewhere classified: Secondary | ICD-10-CM

## 2021-11-14 NOTE — Therapy (Signed)
[facility] Physical Therapy [address] [place], [place], [address] Phone: [phone]   Fax:  [phone]  Occupational Therapy Evaluation  Patient Details  Name: Carrie Combs MRN: [id number] Date of Birth: 09-01-1986 Referring Provider (OT): Dr Frazier Butt   Encounter Date: 11/14/2021   OT End of Session - 11/14/21 1441     Visit Number 1    Number of Visits 12    Date for OT Re-Evaluation 12/12/21    Authorization Type [employer] managed    Progress Note Due on Visit 10    OT Start Time 1321    OT Stop Time 1410    OT Time Calculation (min) 49 min    Activity Tolerance Patient tolerated treatment well    Behavior During Therapy [facility] for tasks assessed/performed             Past Medical History:  Diagnosis Date   Abnormal LFTs 2019   being evaluated for this is due to micro galll stones    Asthma    exercise-induced   History of kidney stones    micro stones   Migraine    monthly with menses   Pancreatitis    at times due to micro gall stones   PONV (postoperative nausea and vomiting)     Past Surgical History:  Procedure Laterality Date   CERVICAL POLYPECTOMY N/A 12/16/2019   Procedure: CERVICAL POLYPECTOMY;  Surgeon: Waynard Reeds, MD;  Location: [facility];  Service: Gynecology;  Laterality: N/A;   CHOLECYSTECTOMY  2003   laparoscopic   EUS N/A 05/01/2016   Procedure: UPPER ENDOSCOPIC ULTRASOUND (EUS) LINEAR;  Surgeon: Rachael Fee, MD;  Location: [facility];  Service: Endoscopy;  Laterality: N/A;   HYSTEROSCOPY WITH D & C N/A 12/16/2019   Procedure: DILATATION AND CURETTAGE /HYSTEROSCOPY;  Surgeon: Waynard Reeds, MD;  Location: [facility];  Service: Gynecology;  Laterality: N/A;   KIDNEY STONE SURGERY  2009   LITHOTRIPSY  12/2015   WISDOM TOOTH EXTRACTION  10/2002    There were no vitals filed for this visit.   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 She is s/p sprain to her left small finger radial collateral ligament with bony contusion per MRI results. She is referred to out-pt hand therapy per Dr Frazier Butt  for non-operative [place] Hand Protocol for ligament sprain L small MCP. Her initial injury occured about 8 weeks ago. She was using a buddy strap and pain was decreasing, when she hit the same finger again about 3-4 weeks ago. Pt reports increased cramping in her hand with composite flexion & as she has been busier at work.    Pertinent History Migraines, Asthma, h/o kidney stones    Patient Stated Goals Increased motion and decreased stiffness    Currently in Pain? Yes    Pain Score 2     Pain Location Hand    Pain Orientation Left    Pain Descriptors / Indicators Aching;Discomfort    Pain Type Acute pain    Pain Onset More than a month ago    Pain Frequency Constant    Aggravating Factors  pt reports increased use, A/ROM can make her hand sore    Pain Relieving Factors rest    Multiple Pain Sites No               [facility] OT Assessment - 11/14/21 0001  Assessment   Medical Diagnosis Sprain left small finger radial collateral ligament w/ bony contusion    Referring Provider (OT) Dr Frazier Butt    Onset Date/Surgical Date --   ~8 weeks ago today   Hand Dominance Right    Next MD Visit Return in 1 month    Prior Therapy Pt was not in therapy but was using a buddy strap that was issued by MD      Balance Screen   Has the patient fallen in the past 6 months No    Has the patient had a decrease in activity level because of a fear of falling?  No    Is the patient reluctant to leave their home because of a fear of falling?  No      Prior Function   Level of Independence Independent    Vocation Full time employment    Vocation Requirements Hair stylist    Leisure Working out, Runner, broadcasting/film/video, 2 small children and 3 dogs.      ADL   ADL comments Pt is Mod I ADL's but requires assist with opening water  bottle and picking up items at home/work, with left hand. She finds that she is using her left hand with her pinky out straight at this time, so she can avid pain/cramping that she experiences with use.      Mobility   Mobility Status Independent      Written Expression   Dominant Hand Right      Cognition   Overall Cognitive Status Within Functional Limits for tasks assessed      Observation/Other Assessments   Observations Edema noted at left small finger MCP upon visual observation in the clinic compared to her right non-injured hand. She reports pain with active ROM for MCP flexion and attempted composite flexion of his left small and RF. She has limitations in ROM and tends to avoid using hersmall finger when grasping/picking things up in an attempt to "not catch her finger'" in UD.   Pt will f/u with Dr Frazier Butt in about a month     Sensation   Light Touch Appears Intact      Coordination   Gross Motor Movements are Fluid and Coordinated No    Fine Motor Movements are Fluid and Coordinated No      Edema   Edema Moderate edema noted in left small finger MCP observed visually as compared to right dominant hand      ROM / Strength   AROM / PROM / Strength AROM;Strength      AROM   Overall AROM  Deficits   refer to left small/RF assessment below   AROM Assessment Site Finger    Right/Left Finger Left    Left Composite Finger Extension --   WFL   Left Composite Finger Flexion --   Impaired     Strength   Overall Strength Deficits;Unable to assess      Left Hand AROM   L Ring  MCP 0-90 82 Degrees    L Ring PIP 0-100 94 Degrees    L Ring DIP 0-70 75 Degrees    L Little  MCP 0-90 50 Degrees    L Little PIP 0-100 92 Degrees    L Little DIP 0-70 76 Degrees      Hand Function   Right Hand Grip (lbs) 62    Left Hand Grip (lbs) 39   left small finger did not assist during grip assessment, pt kept small finger  from grasping JAMAR               OT Treatments/Exercises  (OP) - 11/14/21 0001       Exercises   Exercises Hand      Hand Exercises   Other Hand Exercises Pt was educated in HEP to consist of A/ROM for tendon gliding, finger extension, hook, flat, full/composite fist, active blocked MP, PIP and DIP flexion and composite finger flexion with tan/foam roll left hand. She was able to return demonstration in clinic after instruction.   Pt was instructed to perform HEP a minimum 2-3x/day and do more if able factoring in work/home life     Modalities   Modalities --   Pt may benefit from pulsed [place] left dorsal hand, small MCP area for edema management, pain relief and increased circulation to the area.     Splinting   Splinting After discussion of protocol and pt currently being ~8 weeks s/p injury, it was decided to proceed with buddy strap use on left RF/Small. Buddy straps were trimmed for a better fit and placed just distal to her MCP's and a second strap was placed distal to her PIP's. Pt was noted to be Mod I with don/doffing buddy straps in the clinic today. She was also issued an xs left edema glove for use at noc as  pt states that she sometimes "catches" her finger when stretching and for edema control. SHe was educated in use, care and precautions for all of the above.                OT Education - 11/14/21 1440     Education Details HEP instruction, buddy strap use, edema glove use left hand    Person(s) Educated Patient    Methods Explanation;Demonstration;Verbal cues;Handout    Comprehension Verbalized understanding;Returned demonstration;Need further instruction              OT Short Term Goals - 11/14/21 1246       OT SHORT TERM GOAL #1   Title Pt will be Mod I splinting use, care and precautions left hand    Time 4    Period Weeks    Status New    Target Date 12/12/21      OT SHORT TERM GOAL #2   Title Pt will be Mod I initial home exercise program left hand as seen by independent ability to perform in clinic    Time  4    Period Weeks    Status New    Target Date 12/12/21      OT SHORT TERM GOAL #3   Title Pt will be Mod I edema control techniques as seen by ability to state 1-2 possible techniques indepndently    Time 4    Period Weeks    Status New    Target Date 12/12/21               OT Long Term Goals - 11/14/21 1250       OT LONG TERM GOAL #1   Title Pt will be Mod I updated HEP Left hand    Time 8    Period Weeks    Status New    Target Date 01/09/22      OT LONG TERM GOAL #2   Title Pt will demonstrate improved A/ROM as seen by ability to make flat/full fist left    Time 8    Period Weeks    Status New  Target Date 01/09/22      OT LONG TERM GOAL #3   Title Pt will be Mod I ADL and simulated work related activities as seen in clinic, using left as assist/non-dominant hand    Time 8    Period Weeks    Status New    Target Date 01/09/22      OT LONG TERM GOAL #4   Title Pt will demonstrate functional grip strength left hand as seen by JAMAR assessment of 25# or greater    Time 8    Period Weeks    Status New    Target Date 01/09/22      OT LONG TERM GOAL #5   Title Pt will report decreased pain in left hand as seen by reports of 2/10 or less during homemaking and work related activity    Time 8    Period Weeks    Status New    Target Date 01/09/22               Plan - 11/14/21 1442     Clinical Impression Statement Pt is a pleasant 35 y/o RHD female whom presents per Dr Frazier Butt following a sprain to her left small finger radial collateral ligament w/ bony contusion ~8 weeks ago now. She has deficits in A/ROM, strength, pain, decreased functional use/ability to use left hand. She should benefit from continued out-pt OT for conservative treatment as per [place] Hand Protocol for radial collateral ligament sprain at MP joint, for pt ed, HEP, A/ROM, functional use, edema control, strengthening and modalities. Her PMH includes but is not limited to: migraines,  asthma and nephrolithiasis. She was issued a HEP, buddy straps and an edema glove for her left hand. She will plan to f/u in ~1-2 weeks based on her work/home and holiday schedule.    OT Occupational Profile and History Problem Focused Assessment - Including review of records relating to presenting problem    Occupational performance deficits (Please refer to evaluation for details): ADL's    Body Structure / Function / Physical Skills ADL;Strength;Pain;Edema;UE functional use;ROM;Coordination;Flexibility;FMC;Decreased knowledge of precautions    Rehab Potential Good    Clinical Decision Making Limited treatment options, no task modification necessary    Comorbidities Affecting Occupational Performance: None    Modification or Assistance to Complete Evaluation  No modification of tasks or assist necessary to complete eval    OT Frequency Other (comment)   1-2x/week   OT Duration 8 weeks    OT Treatment/Interventions Self-care/ADL training;Fluidtherapy;DME and/or AE instruction;Splinting;Therapeutic activities;Compression bandaging;Ultrasound;Therapeutic exercise;Cryotherapy;Passive range of motion;Electrical Stimulation;Manual Therapy;Patient/family education    Plan ??Fluidotherapy while performing A/ROM L hand, Check and upgrade HEP as per conservative treatment for Radial collateral Ligament sprain at MP joint (see [place] Hand Protocol pp 332), edema control, A/ROM/tendon gliding.    Consulted and Agree with Plan of Care Patient             Patient will benefit from skilled therapeutic intervention in order to improve the following deficits and impairments:   Body Structure / Function / Physical Skills: ADL, Strength, Pain, Edema, UE functional use, ROM, Coordination, Flexibility, FMC, Decreased knowledge of precautions       Visit Diagnosis: Pain in left finger(s) - Plan: Ot plan of care cert/re-cert  Pain in left hand - Plan: Ot plan of care cert/re-cert  Other lack of  coordination - Plan: Ot plan of care cert/re-cert  Muscle weakness (generalized) - Plan: Ot plan of care cert/re-cert  Localized edema -  Plan: Ot plan of care cert/re-cert  Stiffness of left hand, not elsewhere classified - Plan: Ot plan of care cert/re-cert    Problem List Patient Active Problem List   Diagnosis Date Noted   Injury of collateral ligament of finger of left hand 10/31/2021   Preventative health care 03/09/2016   Nephrolithiasis 01/01/2016   Hx of migraines 01/01/2016   Exercise-induced asthma 01/01/2016   Flexor Tendon Gliding (Active Full Fist) Straighten all fingers, then make a fist, bending all joints. Repeat __5__ times. Do __2-3__ sessions per day.  Flexor Tendon Gliding (Active Hook Fist) With fingers and knuckles straight, bend middle and tip joints. Do not bend large knuckles. Repeat __5_ times. Do __2-3__ sessions per day.  Flexor Tendon Gliding (Active Straight Fist) Start with fingers straight. Bend knuckles and middle joints. Keep fingertip joints straight to touch base of palm. Repeat __5__ times. Do _2-3__ sessions per day.  DIP Flexion (Active Blocked) Hold __your small__ finger firmly at the middle so that only the tip joint can bend. Hold ___5_ seconds. Repeat __5__ times. Do _2-3_ sessions per day.  PIP Flexion (Active Blocked) Bend middle joint of ___small__ finger as far as possible. Hold _5_ seconds. Repeat _5___ times. Do __2-3__ sessions per day.   MP Flexion (Active Blocked) Using other hand to brace base of you small finger, bend as far as possible with tip joint held straight ("make a backwards 7") Repeat ___5_ times. Do _2-3__ sessions per day.  Use tan foam roll to make a hook fist and roll into a full fist taking care to make sure your joints are all touching the foam as you make a fist. Hold here for 5-10 seconds.   Mariam Dollar [place], [place] 11/14/2021, 3:05 PM  [facility] Physical Therapy [address] [place], [place], [address] Phone: [phone]   Fax:  [phone]  Name: Carrie Combs MRN: [id number] Date of Birth: 1986-01-14

## 2021-11-14 NOTE — Patient Instructions (Addendum)
Flexor Tendon Gliding (Active Full Fist)    Straighten all fingers, then make a fist, bending all joints. Repeat __5__ times. Do __2-3__ sessions per day.  Flexor Tendon Gliding (Active Hook Fist)    With fingers and knuckles straight, bend middle and tip joints. Do not bend large knuckles. Repeat __5_ times. Do __2-3__ sessions per day.  Flexor Tendon Gliding (Active Straight Fist)    Start with fingers straight. Bend knuckles and middle joints. Keep fingertip joints straight to touch base of palm. Repeat __5__ times. Do _2-3__ sessions per day.  DIP Flexion (Active Blocked)    Hold __your small__ finger firmly at the middle so that only the tip joint can bend. Hold ___5_ seconds. Repeat __5__ times. Do _2-3_ sessions per day.  PIP Flexion (Active Blocked)     Bend middle joint of ___small__ finger as far as possible. Hold _5_ seconds. Repeat _5___ times. Do __2-3__ sessions per day.   MP Flexion (Active Blocked)    Using other hand to brace base of you small finger, bend as far as possible with tip joint held straight (make  backwards 7) Repeat ___5_ times. Do _2-3__ sessions per day.   Use tan foam roll to make a hook fist and roll into a full fist taking care to make sure your joints are all touching the foam as you make a fist. Hold here for 5-10 seconds.

## 2022-03-25 ENCOUNTER — Encounter: Payer: Self-pay | Admitting: Family

## 2022-03-25 ENCOUNTER — Ambulatory Visit (INDEPENDENT_AMBULATORY_CARE_PROVIDER_SITE_OTHER): Payer: Managed Care, Other (non HMO) | Admitting: Family

## 2022-03-25 VITALS — BP 108/76 | HR 69 | Temp 98.2°F | Resp 16 | Ht 63.0 in | Wt 116.0 lb

## 2022-03-25 DIAGNOSIS — Z0000 Encounter for general adult medical examination without abnormal findings: Secondary | ICD-10-CM | POA: Diagnosis not present

## 2022-03-25 NOTE — Patient Instructions (Addendum)
Please complete lab work prior to leaving.   

## 2022-03-25 NOTE — Progress Notes (Signed)
? ?Subjective:  ? ?By signing my name below, I, Carrie Combs, attest that this documentation has been prepared under the direction and in the presence of Carrie Combs, 03/25/2022   ? ? Patient ID: Carrie Combs, female    DOB: [birth year], 36 y.o.   MRN: [id number] ? ?Chief Complaint  ?Patient presents with  ? Annual Exam  ?   ?  ? ? ?HPI ?Patient is in today for a comprehensive physical exam. ? ?Constipation - To help symptoms, she has been taking AG1 on and off. She also has been making homemade ginger shots.  ? ?Menstruation - Her cycles are consistent. She occasionally gets headaches during her cycles. The headaches can range from a foggy feeling to an occasional strong feeling. She states that she is still functional if the headaches become severe.  ? ?Anxiety - She realizes that she has anxiety to a certain degree. She does not want to take medication at the time. She has switched from drinking coffee to matcha green.  ? ?She denies having any fever, ear pain, new muscle pain, joint pain, new moles, congestion, sinus pain, sore throat, palpations, wheezing, n/v/d, , blood in stool, dysuria, frequency, hematuria, depresssion at this time. ? ?Social History: She reports no recent surgeries. Her maternal grandmother was diagnosed with carcinoma in the cervix.  ?Pap Smear: Last completed on 06/24/2019. She is getting a pap smear on 03/31/2022 ?Immunizations: She has taken 3 COVID 16 Moderna vaccines.  ?Exercise: She has begun strength training.  ?  ? ? ?Health Maintenance Due  ?Topic Date Due  ? COVID-19 Vaccine (3 - Booster for Moderna series) 01/12/2021  ? ? ?Past Medical History:  ?Diagnosis Date  ? Abnormal LFTs 2019  ? being evaluated for this is due to micro galll stones   ? Asthma   ? exercise-induced  ? History of kidney stones   ? micro stones  ? Migraine   ? monthly with menses  ? Pancreatitis   ? at times due to micro gall stones  ? PONV (postoperative nausea and vomiting)   ? ? ?Past Surgical  History:  ?Procedure Laterality Date  ? CERVICAL POLYPECTOMY N/A 12/16/2019  ? Procedure: CERVICAL POLYPECTOMY;  Surgeon: Vanessa Kick, MD;  Location: [facility];  Service: Gynecology;  Laterality: N/A;  ? CHOLECYSTECTOMY  2003  ? laparoscopic  ? EUS N/A 05/01/2016  ? Procedure: UPPER ENDOSCOPIC ULTRASOUND (EUS) LINEAR;  Surgeon: Milus Banister, MD;  Location: [facility];  Service: Endoscopy;  Laterality: N/A;  ? HYSTEROSCOPY WITH D & C N/A 12/16/2019  ? Procedure: DILATATION AND CURETTAGE /HYSTEROSCOPY;  Surgeon: Vanessa Kick, MD;  Location: [facility];  Service: Gynecology;  Laterality: N/A;  ? KIDNEY STONE SURGERY  2009  ? LITHOTRIPSY  12/2015  ? WISDOM TOOTH EXTRACTION  10/2002  ? ? ?Family History  ?Problem Relation Age of Onset  ? Hyperlipidemia Mother   ? Diabetes Father   ? Hypertension Father   ? Alcohol abuse Father   ? Frontotemporal dementia Father   ?     tentative (still undergoing work up -- 03/2017)  ? Migraines Sister   ? Rheum arthritis Sister   ? Alzheimer's disease Maternal Grandmother   ? Arthritis Maternal Grandmother   ? Cervical cancer Maternal Grandmother   ? Parkinson's disease Maternal Grandfather   ? Stroke Paternal Grandmother   ? Heart disease Paternal Grandmother   ? Cancer Paternal Grandfather   ?  liver  ? Alcohol abuse Paternal Grandfather   ? Obesity Half-Sister   ? Obesity Half-Sister   ? Other Neg Hx   ? ? ?Social History  ? ?Socioeconomic History  ? Marital status: Married  ?  Spouse name: Not on file  ? Number of children: Not on file  ? Years of education: Not on file  ? Highest education level: Not on file  ?Occupational History  ? Not on file  ?Tobacco Use  ? Smoking status: Never  ? Smokeless tobacco: Never  ?Vaping Use  ? Vaping Use: Never used  ?Substance and Sexual Activity  ? Alcohol use: Yes  ?  Comment: occ  ? Drug use: No  ? Sexual activity: Not on file  ?  Comment: husband had vasectomy  ?Other Topics Concern  ? Not on file  ?Social  History Narrative  ? 2014- daughter  ? 2016- daugher  ? Married  ? 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   ? Completed college  ? Enjoys spending time with friends, reading.   ? Completed college  ? ?Social Determinants of Health  ? ?Financial Resource Strain: Not on file  ?Food Insecurity: Not on file  ?Transportation Needs: Not on file  ?Physical Activity: Not on file  ?Stress: Not on file  ?Social Connections: Not on file  ?Intimate Partner Violence: Not on file  ? ? ?Outpatient Medications Prior to Visit  ?Medication Sig Dispense Refill  ? aspirin EC 81 MG tablet Take 1 tablet (81 mg total) by mouth daily.    ? cetirizine (ZYRTEC) 10 MG tablet Take 10 mg by mouth daily.    ? ibuprofen (ADVIL) 600 MG tablet Take 1 tablet (600 mg total) by mouth every 6 (six) hours as needed. 90 tablet 0  ? magnesium oxide (MAG-OX) 400 MG tablet Take 400 mg by mouth daily.    ? ?No facility-administered medications prior to visit.  ? ? ?Allergies  ?Allergen Reactions  ? Hydrocodone Nausea And Vomiting  ? ? ?Review of Systems  ?Constitutional:  Negative for fever.  ?HENT:  Negative for congestion, ear pain, sinus pain and sore throat.   ?Respiratory:  Negative for wheezing.   ?Cardiovascular:  Negative for palpitations.  ?Gastrointestinal:  Positive for constipation. Negative for blood in stool, diarrhea, nausea and vomiting.  ?Genitourinary:  Negative for dysuria, frequency and hematuria.  ?Musculoskeletal:  Negative for joint pain and myalgias.  ?Skin:   ?     (-) [place]  ?Neurological:  Positive for headaches.  ?Psychiatric/Behavioral:  Negative for depression. The patient is nervous/anxious.   ? ?   ?Objective:  ?  ?Physical Exam ?Constitutional:   ?   General: She is not in acute distress. ?   Appearance: Normal appearance. She is not ill-appearing.  ?HENT:  ?   Head: Normocephalic and atraumatic.  ?   Right Ear: Tympanic membrane, ear canal and external ear normal.  ?   Left Ear: Tympanic membrane, ear canal and  external ear normal.  ?Eyes:  ?   Extraocular Movements: Extraocular movements intact.  ?   Pupils: Pupils are equal, round, and reactive to light.  ?Cardiovascular:  ?   Rate and Rhythm: Normal rate and regular rhythm.  ?   Heart sounds: Normal heart sounds. No murmur heard. ?  No gallop.  ?Pulmonary:  ?   Effort: Pulmonary effort is normal. No respiratory distress.  ?   Breath sounds: Normal breath sounds. No wheezing or rales.  ?  Abdominal:  ?   General: Bowel sounds are normal. There is no distension.  ?   Palpations: Abdomen is soft.  ?   Tenderness: There is no abdominal tenderness. There is no guarding.  ?Skin: ?   General: Skin is warm and dry.  ?Neurological:  ?   Mental Status: She is alert and oriented to person, place, and time.  ?Psychiatric:     ?   Mood and Affect: Mood normal.     ?   Behavior: Behavior normal.     ?   Judgment: Judgment normal.  ? ? ?BP 108/76 (BP Location: Left Arm, Patient Position: Sitting, Cuff Size: Small)   Pulse 69   Temp 98.2 ?F (36.8 ?C) (Oral)   Resp 16   Ht _0  (1.6 m)   Wt 116 lb (52.6 kg)   SpO2 100%   BMI 20.55 kg/m?  ?Wt Readings from Last 3 Encounters:  ?03/25/22 116 lb (52.6 kg)  ?10/08/21 115 lb (52.2 kg)  ?09/19/21 115 lb (52.2 kg)  ? ? ?   ?Assessment & Plan:  ? ?Problem List Items Addressed This Visit   ? ?  ? Unprioritized  ? Preventative health care - Primary  ?  Wt Readings from Last 3 Encounters:  ?03/25/22 116 lb (52.6 kg)  ?10/08/21 115 lb (52.2 kg)  ?09/19/21 115 lb (52.2 kg)  ?Continue healthy diet, regular exercise. She has follow up with GYN for Pap. Labs as ordered for her health insurance form.  ?  ?  ? Relevant Orders  ? Comp Met (CMET)  ? Lipid panel  ? ? ? ? ?No orders of the defined types were placed in this encounter. ? ? ?I, Nance Pear, Combs, personally preformed the services described in this documentation.  All medical record entries made by the scribe were at my direction and in my presence.  I have reviewed the chart and  discharge instructions (if applicable) and agree that the record reflects my personal performance and is accurate and complete. 03/25/2022 ? ? ?I,Amber Collins,acting as a Education administrator for Nance Pear, Combs.,ha

## 2022-03-25 NOTE — Assessment & Plan Note (Addendum)
Wt Readings from Last 3 Encounters:  ?03/25/22 116 lb (52.6 kg)  ?10/08/21 115 lb (52.2 kg)  ?09/19/21 115 lb (52.2 kg)  ? ?Continue healthy diet, regular exercise. She has follow up with GYN for Pap. Labs as ordered for her health insurance form.  ?

## 2022-03-26 LAB — LIPID PANEL
Cholesterol: 163 mg/dL (ref 0–200)
HDL: 63.7 mg/dL (ref >39.00)
LDL Cholesterol: 78 mg/dL (ref 0–99)
NonHDL: 99.59
Total CHOL/HDL Ratio: 3
Triglycerides: 109 mg/dL (ref 0.0–149.0)
VLDL: 21.8 mg/dL (ref 0.0–40.0)

## 2022-03-26 LAB — COMPREHENSIVE METABOLIC PANEL
ALT: 21 U/L (ref 0–35)
AST: 35 U/L (ref 0–37)
Albumin: 4.8 g/dL (ref 3.5–5.2)
Alkaline Phosphatase: 75 U/L (ref 39–117)
BUN: 11 mg/dL (ref 6–23)
CO2: 30 mEq/L (ref 19–32)
Calcium: 9.7 mg/dL (ref 8.4–10.5)
Chloride: 99 mEq/L (ref 96–112)
Creatinine, Ser: 0.59 mg/dL (ref 0.40–1.20)
GFR: 114.79 mL/min (ref >60.00)
Glucose, Bld: 95 mg/dL (ref 70–99)
Potassium: 4.4 mEq/L (ref 3.5–5.1)
Sodium: 137 mEq/L (ref 135–145)
Total Bilirubin: 0.4 mg/dL (ref 0.2–1.2)
Total Protein: 7.3 g/dL (ref 6.0–8.3)

## 2022-09-26 LAB — HM COLONOSCOPY

## 2022-11-15 ENCOUNTER — Telehealth: Payer: Managed Care, Other (non HMO) | Admitting: Nurse Practitioner

## 2022-11-15 DIAGNOSIS — J0100 Acute maxillary sinusitis, unspecified: Secondary | ICD-10-CM | POA: Diagnosis not present

## 2022-11-15 MED ORDER — AMOXICILLIN-POT CLAVULANATE 875-125 MG PO TABS: 1.0000 | ORAL_TABLET | Freq: Two times a day (BID) | ORAL | 0 refills | Status: DC

## 2022-11-15 NOTE — Progress Notes (Signed)

## 2023-03-26 NOTE — Progress Notes (Signed)
Subjective:   By signing my name below, I, Carrie Combs, attest that this documentation has been prepared under the direction and in the presence of Lemont Fillers, NP 03/27/23   Patient ID: Carrie Combs, female    DOB: 1985/12/20, 37 y.o.   MRN: [id number]  Chief Complaint  Patient presents with   Annual Exam    HPI Patient is in today for a comprehensive physical exam.   Internal hemorrhoids: She reports having blood with bowel movements. She followed up with Gi and had a colonoscopy in 08/2022 which showed internal hemorrhoids. She has been avoiding constipation by drinking a green probiotic drink.   Migraines: She reports she has been having frequent migraines. She endorses associated nausea and vision changes with spinning sensation. She has tried rescue medication but notes migraine at times returns after medication wears off.   Hormonal changes: She endorses fatigue, hot/cold sweats, longer periods, stress, and changes in hair (feeling brittle). She believes this may be perimenopause, though she is not over 38 yet.   Acute:  She denies having any fever, new muscle pain, new joint pain, new moles, congestion, sinus pain, sore throat, chest pain, palpitations, cough, SOB, wheezing, v/d, dysuria, frequency, or hematuria, at this time.   Immunizations: Tdap 2016. Next due in 2026.  Diet: Previously was gluten free, considering going gluten free again.   Exercise: She is exercising regularly. Previously did HIIT but is now doing bar exercises.   Last colonoscopy: 09/26/2022. Internal hemorrhoids. Results were normal.   Last pap: 08/2022. [facility] with Dr. Ernestina Penna   Dental/Vision: She is UTD on routine dental and vision care.  Past Medical History:  Diagnosis Date   Abnormal LFTs 2019   being evaluated for this is due to micro galll stones    Asthma    exercise-induced   History of kidney stones    micro stones   Internal hemorrhoids    Migraine     monthly with menses   Pancreatitis    at times due to micro gall stones   PONV (postoperative nausea and vomiting)     Past Surgical History:  Procedure Laterality Date   CERVICAL POLYPECTOMY N/A 12/16/2019   Procedure: CERVICAL POLYPECTOMY;  Surgeon: Waynard Reeds, MD;  Location: [facility];  Service: Gynecology;  Laterality: N/A;   CHOLECYSTECTOMY  2003   laparoscopic   EUS N/A 05/01/2016   Procedure: UPPER ENDOSCOPIC ULTRASOUND (EUS) LINEAR;  Surgeon: Rachael Fee, MD;  Location: [facility];  Service: Endoscopy;  Laterality: N/A;   HYSTEROSCOPY WITH D & C N/A 12/16/2019   Procedure: DILATATION AND CURETTAGE /HYSTEROSCOPY;  Surgeon: Waynard Reeds, MD;  Location: [facility];  Service: Gynecology;  Laterality: N/A;   KIDNEY STONE SURGERY  2009   LITHOTRIPSY  12/2015   WISDOM TOOTH EXTRACTION  10/2002    Family History  Problem Relation Age of Onset   Hyperlipidemia Mother    Diabetes Mellitus II Mother        borderline   Diabetes Father    Hypertension Father    Alcohol abuse Father    Frontotemporal dementia Father        tentative (still undergoing work up -- 03/2017)   Migraines Sister    Rheum arthritis Sister    Alzheimer's disease Maternal Grandmother    Arthritis Maternal Grandmother    Cervical cancer Maternal Grandmother    Parkinson's disease Maternal Grandfather    Stroke Paternal Grandmother  Heart disease Paternal Grandmother    Cancer Paternal Grandfather        liver   Alcohol abuse Paternal Grandfather    Obesity Half-Sister    Obesity Half-Sister    Other Neg Hx     Social History   Socioeconomic History   Marital status: Married    Spouse name: Not on file   Number of children: Not on file   Years of education: Not on file   Highest education level: Not on file  Occupational History   Not on file  Tobacco Use   Smoking status: Never   Smokeless tobacco: Never  Vaping Use   Vaping Use: Never used  Substance  and Sexual Activity   Alcohol use: Yes    Comment: occ   Drug use: No   Sexual activity: Not on file    Comment: husband had vasectomy  Other Topics Concern   Not on file  Social History Narrative   2014- daughter   2016- daugher   Married   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    Completed college   Enjoys spending time with friends, reading.    Completed college   Social Determinants of Health   Financial Resource Strain: Not on file  Food Insecurity: Not on file  Transportation Needs: Not on file  Physical Activity: Not on file  Stress: Not on file  Social Connections: Not on file  Intimate Partner Violence: Not on file    Outpatient Medications Prior to Visit  Medication Sig Dispense Refill   aspirin EC 81 MG tablet Take 1 tablet (81 mg total) by mouth daily.     cetirizine (ZYRTEC) 10 MG tablet Take 10 mg by mouth daily.     ibuprofen (ADVIL) 600 MG tablet Take 1 tablet (600 mg total) by mouth every 6 (six) hours as needed. 90 tablet 0   magnesium oxide (MAG-OX) 400 MG tablet Take 400 mg by mouth daily.     amoxicillin-clavulanate (AUGMENTIN) 875-125 MG tablet Take 1 tablet by mouth 2 (two) times daily. 14 tablet 0   No facility-administered medications prior to visit.    Allergies  Allergen Reactions   Hydrocodone Nausea And Vomiting    Review of Systems  Gastrointestinal:  Positive for nausea (attributed to migraines).  Neurological:  Positive for headaches (migraines with nausea and vision changes).       Objective:    Physical Exam Constitutional:      General: She is not in acute distress.    Appearance: Normal appearance. She is not ill-appearing.  HENT:     Head: Normocephalic and atraumatic.     Right Ear: Tympanic membrane, ear canal and external ear normal.     Left Ear: Tympanic membrane, ear canal and external ear normal.  Eyes:     Extraocular Movements: Extraocular movements intact.     Right eye: No nystagmus.     Left eye:  No nystagmus.     Pupils: Pupils are equal, round, and reactive to light.  Neck:     Thyroid: No thyroid tenderness.  Cardiovascular:     Rate and Rhythm: Normal rate and regular rhythm.     Heart sounds: Normal heart sounds. No murmur heard.    No gallop.  Pulmonary:     Effort: Pulmonary effort is normal. No respiratory distress.     Breath sounds: Normal breath sounds. No wheezing or rales.  Abdominal:     General: There is no distension.  Palpations: Abdomen is soft.     Tenderness: There is no abdominal tenderness. There is no guarding.  Musculoskeletal:     Comments: 5/5 strength in both upper and lower extremities  Lymphadenopathy:     Cervical: No cervical adenopathy.  Skin:    General: Skin is warm and dry.  Neurological:     Mental Status: She is alert and oriented to person, place, and time.     Deep Tendon Reflexes:     Reflex Scores:      Patellar reflexes are 2+ on the right side and 2+ on the left side. Psychiatric:        Judgment: Judgment normal.     BP 112/72   Pulse 78   Temp 98.2 F (36.8 C)   Resp 18   Ht 5\' 3"  (1.6 m)   Wt 117 lb (53.1 kg)   SpO2 100%   BMI 20.73 kg/m  Wt Readings from Last 3 Encounters:  03/27/23 117 lb (53.1 kg)  03/25/22 116 lb (52.6 kg)  10/08/21 115 lb (52.2 kg)       Assessment & Plan:  Preventative health care Assessment & Plan: Continue healthy diet and regular exercise.  Tetanus and pap up to date. Dental and vision up to date.   Orders: -     Hemoglobin A1c -     Comprehensive metabolic panel -     TSH -     Lipid panel  Migraine with aura and without status migrainosus, not intractable Assessment & Plan: Uncontrolled. Will give trial of imitrex prn.   Orders: -     SUMAtriptan Succinate; Take 1 tablet (50 mg total) by mouth every 2 (two) hours as needed for migraine. May repeat in 2 hours if headache persists or recurs.  Dispense: 10 tablet; Refill: 5  Hot flashes Assessment & Plan: Will check  hormonal studies.  She is concerned that she is perimenopausal.   Orders: -     Follicle stimulating hormone -     Luteinizing hormone -     Estradiol     I,Rachel Rivera,acting as a scribe for Lemont Fillers, NP.,have documented all relevant documentation on the behalf of Lemont Fillers, NP,as directed by  Lemont Fillers, NP while in the presence of Lemont Fillers, NP.   I, Lemont Fillers, NP, personally preformed the services described in this documentation.  All medical record entries made by the scribe were at my direction and in my presence.  I have reviewed the chart and discharge instructions (if applicable) and agree that the record reflects my personal performance and is accurate and complete. 03/27/23   Lemont Fillers, NP

## 2023-03-27 ENCOUNTER — Ambulatory Visit (INDEPENDENT_AMBULATORY_CARE_PROVIDER_SITE_OTHER): Payer: Managed Care, Other (non HMO) | Admitting: Family

## 2023-03-27 ENCOUNTER — Encounter: Payer: Self-pay | Admitting: Family

## 2023-03-27 ENCOUNTER — Telehealth: Payer: Self-pay | Admitting: Family

## 2023-03-27 VITALS — BP 112/72 | HR 78 | Temp 98.2°F | Resp 18 | Ht 63.0 in | Wt 117.0 lb

## 2023-03-27 DIAGNOSIS — R232 Flushing: Secondary | ICD-10-CM | POA: Diagnosis not present

## 2023-03-27 DIAGNOSIS — G43109 Migraine with aura, not intractable, without status migrainosus: Secondary | ICD-10-CM

## 2023-03-27 DIAGNOSIS — Z0000 Encounter for general adult medical examination without abnormal findings: Secondary | ICD-10-CM

## 2023-03-27 DIAGNOSIS — Z0001 Encounter for general adult medical examination with abnormal findings: Secondary | ICD-10-CM | POA: Diagnosis not present

## 2023-03-27 LAB — LIPID PANEL
Cholesterol: 169 mg/dL (ref 0–200)
HDL: 58.9 mg/dL (ref >39.00)
LDL Cholesterol: 91 mg/dL (ref 0–99)
NonHDL: 109.97
Total CHOL/HDL Ratio: 3
Triglycerides: 96 mg/dL (ref 0.0–149.0)
VLDL: 19.2 mg/dL (ref 0.0–40.0)

## 2023-03-27 LAB — COMPREHENSIVE METABOLIC PANEL
ALT: 13 U/L (ref 0–35)
AST: 14 U/L (ref 0–37)
Albumin: 4.4 g/dL (ref 3.5–5.2)
Alkaline Phosphatase: 60 U/L (ref 39–117)
BUN: 12 mg/dL (ref 6–23)
CO2: 31 mEq/L (ref 19–32)
Calcium: 9.7 mg/dL (ref 8.4–10.5)
Chloride: 102 mEq/L (ref 96–112)
Creatinine, Ser: 0.63 mg/dL (ref 0.40–1.20)
GFR: 112.2 mL/min (ref >60.00)
Glucose, Bld: 55 mg/dL — ABNORMAL LOW (ref 70–99)
Potassium: 4.4 mEq/L (ref 3.5–5.1)
Sodium: 139 mEq/L (ref 135–145)
Total Bilirubin: 0.4 mg/dL (ref 0.2–1.2)
Total Protein: 6.9 g/dL (ref 6.0–8.3)

## 2023-03-27 LAB — FOLLICLE STIMULATING HORMONE: FSH: 6.7 m[IU]/mL

## 2023-03-27 LAB — HEMOGLOBIN A1C: Hgb A1c MFr Bld: 5.5 % (ref 4.6–6.5)

## 2023-03-27 LAB — TSH: TSH: 1.55 u[IU]/mL (ref 0.35–5.50)

## 2023-03-27 LAB — LUTEINIZING HORMONE: LH: 10.45 m[IU]/mL

## 2023-03-27 MED ORDER — SUMATRIPTAN SUCCINATE 50 MG PO TABS: 50.0000 mg | ORAL_TABLET | ORAL | 5 refills | Status: DC | PRN

## 2023-03-27 NOTE — Assessment & Plan Note (Signed)
Uncontrolled. Will give trial of imitrex prn.

## 2023-03-27 NOTE — Telephone Encounter (Signed)
Please call and request a copy of her pap from Dr. Algie Coffer.

## 2023-03-27 NOTE — Assessment & Plan Note (Signed)
Continue healthy diet and regular exercise.  Tetanus and pap up to date. Dental and vision up to date.

## 2023-03-27 NOTE — Assessment & Plan Note (Signed)
Will check hormonal studies.  She is concerned that she is perimenopausal.

## 2023-03-27 NOTE — Telephone Encounter (Signed)
Request sent over for pap

## 2023-03-28 LAB — ESTRADIOL: Estradiol: 114 pg/mL

## 2023-03-30 ENCOUNTER — Encounter: Payer: Self-pay | Admitting: Family

## 2023-03-31 ENCOUNTER — Encounter: Payer: Self-pay | Admitting: *Deleted

## 2023-05-08 ENCOUNTER — Encounter: Payer: Self-pay | Admitting: Family

## 2023-11-23 ENCOUNTER — Ambulatory Visit (HOSPITAL_BASED_OUTPATIENT_CLINIC_OR_DEPARTMENT_OTHER)
Admission: RE | Admit: 2023-11-23 | Discharge: 2023-11-23 | Disposition: A | Discharge status: Home / Self Care | Payer: Managed Care, Other (non HMO) | Source: Ambulatory Visit | Attending: Family | Admitting: Family

## 2023-11-23 ENCOUNTER — Ambulatory Visit: Payer: Managed Care, Other (non HMO) | Admitting: Family

## 2023-11-23 ENCOUNTER — Telehealth: Payer: Self-pay | Admitting: Family

## 2023-11-23 VITALS — BP 106/57 | HR 78 | Temp 98.7°F | Resp 16 | Ht 63.0 in | Wt 117.0 lb

## 2023-11-23 DIAGNOSIS — J45990 Exercise induced bronchospasm: Secondary | ICD-10-CM

## 2023-11-23 DIAGNOSIS — J209 Acute bronchitis, unspecified: Secondary | ICD-10-CM | POA: Insufficient documentation

## 2023-11-23 DIAGNOSIS — R062 Wheezing: Secondary | ICD-10-CM | POA: Diagnosis present

## 2023-11-23 MED ORDER — AZITHROMYCIN 250 MG PO TABS: ORAL_TABLET | ORAL | 0 refills | Status: AC

## 2023-11-23 MED ORDER — ALBUTEROL SULFATE HFA 108 (90 BASE) MCG/ACT IN AERS: 2.0000 | INHALATION_SPRAY | Freq: Four times a day (QID) | RESPIRATORY_TRACT | 0 refills | Status: DC | PRN

## 2023-11-23 MED ORDER — PREDNISONE 10 MG PO TABS: ORAL_TABLET | ORAL | 0 refills | Status: DC

## 2023-11-23 MED ORDER — PROMETHAZINE-DM 6.25-15 MG/5ML PO SYRP: 5.0000 mL | ORAL_SOLUTION | Freq: Four times a day (QID) | ORAL | 0 refills | Status: DC | PRN

## 2023-11-23 NOTE — Progress Notes (Signed)
Subjective:     Patient ID: Carrie Combs, female    DOB: 09-30-1986, 37 y.o.   MRN: [id number]  Chief Complaint  Patient presents with   Wheezing    Patient complains of wheezing at times    Wheezing     Discussed the use of AI scribe software for clinical note transcription with the patient, who gave verbal consent to proceed.  History of Present Illness   The patient presents with a 10 day history of chest discomfort and cough. She describes the chest discomfort as a sensation of 'something trying to get in my chest,' with occasional 'crackle' or 'bubble popping' sounds when breathing in. This is not consistent but occurs intermittently. She also reports a slight wheeze on deep inspiration. The cough is described as dry and more pronounced when lying down. She also reports feeling lethargic and tired. The patient has a recent history of exposure to bronchitis, as her husband was diagnosed and treated for it two weeks ago. Her children had pneumonia in October. The patient is trying to be proactive in managing these symptoms due to upcoming personal and professional commitments.          Health Maintenance Due  Topic Date Due   INFLUENZA VACCINE  07/02/2023   COVID-19 Vaccine (4 - 2024-25 season) 08/02/2023    Past Medical History:  Diagnosis Date   Abnormal LFTs 2019   being evaluated for this is due to micro galll stones    Asthma    exercise-induced   History of kidney stones    micro stones   Internal hemorrhoids    Migraine    monthly with menses   Pancreatitis    at times due to micro gall stones   PONV (postoperative nausea and vomiting)     Past Surgical History:  Procedure Laterality Date   CERVICAL POLYPECTOMY N/A 12/16/2019   Procedure: CERVICAL POLYPECTOMY;  Surgeon: Waynard Reeds, MD;  [facility]  Service: Gynecology;  Laterality: N/A;   CHOLECYSTECTOMY  2003   laparoscopic   EUS N/A 05/01/2016   Procedure: UPPER ENDOSCOPIC  ULTRASOUND (EUS) LINEAR;  Surgeon: Rachael Fee, MD;  Location: [facility];  Service: Endoscopy;  Laterality: N/A;   HYSTEROSCOPY WITH D & C N/A 12/16/2019   Procedure: DILATATION AND CURETTAGE /HYSTEROSCOPY;  Surgeon: Waynard Reeds, MD;  Location: [facility];  Service: Gynecology;  Laterality: N/A;   KIDNEY STONE SURGERY  2009   LITHOTRIPSY  12/2015   WISDOM TOOTH EXTRACTION  10/2002    Family History  Problem Relation Age of Onset   Hyperlipidemia Mother    Diabetes Mellitus II Mother        borderline   Diabetes Father    Hypertension Father    Alcohol abuse Father    Frontotemporal dementia Father        tentative (still undergoing work up -- 03/2017)   Migraines Sister    Rheum arthritis Sister    Alzheimer's disease Maternal Grandmother    Arthritis Maternal Grandmother    Cervical cancer Maternal Grandmother    Parkinson's disease Maternal Grandfather    Stroke Paternal Grandmother    Heart disease Paternal Grandmother    Cancer Paternal Grandfather        liver   Alcohol abuse Paternal Grandfather    Obesity Half-Sister    Obesity Half-Sister    Other Neg Hx     Social History   Socioeconomic History   Marital  status: Married    Spouse name: Not on file   Number of children: Not on file   Years of education: Not on file   Highest education level: Not on file  Occupational History   Not on file  Tobacco Use   Smoking status: Never   Smokeless tobacco: Never  Vaping Use   Vaping status: Never Used  Substance and Sexual Activity   Alcohol use: Yes    Comment: occ   Drug use: No   Sexual activity: Not on file    Comment: husband had vasectomy  Other Topics Concern   Not on file  Social History Narrative   2014- daughter   2016- daugher   Married   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    Completed college   Enjoys spending time with friends, reading.    Completed college   Social Drivers of Health   Financial Resource  Strain: Low Risk  (07/24/2022)   Received from [facility], [facility]   Overall Financial Resource Strain (CARDIA)    Difficulty of Paying Living Expenses: Not hard at all  Food Insecurity: No Food Insecurity (07/24/2022)   Received from [facility], [facility]   Hunger Vital Sign    Worried About Running Out of Food in the Last Year: Never true    Ran Out of Food in the Last Year: Never true  Transportation Needs: Not on file  Physical Activity: Sufficiently Active (07/24/2022)   Received from [facility], [facility]   Exercise Vital Sign    Days of Exercise per Week: 5 days    Minutes of Exercise per Session: 40 min  Stress: No Stress Concern Present (07/24/2022)   Received from [facility], [facility] - Occupational Stress Questionnaire    Feeling of Stress : Not at all  Social Connections: Unknown (08/14/2023)   Received from [facility]   Social Network    Social Network: Not on file  Intimate Partner Violence: Unknown (08/14/2023)   Received from [facility]   HITS    Physically Hurt: Not on file    Insult or Talk Down To: Not on file    Threaten Physical Harm: Not on file    Scream or Curse: Not on file    Outpatient Medications Prior to Visit  Medication Sig Dispense Refill   aspirin EC 81 MG tablet Take 1 tablet (81 mg total) by mouth daily.     cetirizine (ZYRTEC) 10 MG tablet Take 10 mg by mouth daily.     ibuprofen (ADVIL) 600 MG tablet Take 1 tablet (600 mg total) by mouth every 6 (six) hours as needed. 90 tablet 0   magnesium oxide (MAG-OX) 400 MG tablet Take 400 mg by mouth daily.     SUMAtriptan (IMITREX) 50 MG tablet Take 1 tablet (50 mg total) by mouth every 2 (two) hours as needed for migraine. May repeat in 2 hours if headache persists or recurs. 10 tablet 5   No facility-administered medications prior to visit.    Allergies  Allergen Reactions   Hydrocodone Nausea And Vomiting    Review  of Systems  Respiratory:  Positive for wheezing.        Objective:    Physical Exam Constitutional:      General: She is not in acute distress.    Appearance: Normal appearance. She is well-developed.  HENT:     Head: Normocephalic and atraumatic.  Right Ear: Tympanic membrane, ear canal and external ear normal.     Left Ear: Tympanic membrane, ear canal and external ear normal.     Mouth/Throat:     Pharynx: No pharyngeal swelling or posterior oropharyngeal erythema.  Eyes:     General: No scleral icterus. Neck:     Thyroid: No thyromegaly.  Cardiovascular:     Rate and Rhythm: Normal rate and regular rhythm.     Heart sounds: Normal heart sounds. No murmur heard. Pulmonary:     Effort: Pulmonary effort is normal. No respiratory distress.     Breath sounds: Wheezing (bilateral expiratory wheeze) present.  Musculoskeletal:     Cervical back: Neck supple.  Skin:    General: Skin is warm and dry.  Neurological:     Mental Status: She is alert and oriented to person, place, and time.  Psychiatric:        Mood and Affect: Mood normal.        Behavior: Behavior normal.        Thought Content: Thought content normal.        Judgment: Judgment normal.      BP (!) 106/57 (BP Location: Right Arm, Patient Position: Sitting, Cuff Size: Small)   Pulse 78   Temp 98.7 F (37.1 C) (Oral)   Resp 16   Ht 5\' 3"  (1.6 m)   Wt 117 lb (53.1 kg)   LMP 11/16/2023   SpO2 99%   BMI 20.73 kg/m  Wt Readings from Last 3 Encounters:  11/23/23 117 lb (53.1 kg)  03/27/23 117 lb (53.1 kg)  03/25/22 116 lb (52.6 kg)       Assessment & Plan:   Problem List Items Addressed This Visit       Unprioritized   Exercise-induced asthma   Relevant Medications   albuterol (VENTOLIN HFA) 108 (90 Base) MCG/ACT inhaler   predniSONE (DELTASONE) 10 MG tablet   Other Relevant Orders   Ambulatory referral to Pulmonology   Bronchitis with bronchospasm   New.  CXR is performed and notes the  following:   Moderate pulmonary interstitial thickening, likely related to the clinical history of asthma. Chronic bronchitis could look similar.  No evidence of pneumonia.   Rx with albuterol, prednisone, zpak. I would like for her to see pulmonology as well due to the interstitial changes noted on CXR to make sure that she doesn't have any other type of interstitial lung disease.      Relevant Medications   azithromycin (ZITHROMAX) 250 MG tablet   Other Visit Diagnoses       Wheezing    -  Primary   Relevant Medications   albuterol (VENTOLIN HFA) 108 (90 Base) MCG/ACT inhaler   predniSONE (DELTASONE) 10 MG tablet   Other Relevant Orders   DG Chest 2 View (Completed)       I am having Carrie Combs start on albuterol, predniSONE, promethazine-dextromethorphan, and azithromycin. I am also having her maintain her ibuprofen, aspirin EC, cetirizine, magnesium oxide, and SUMAtriptan.  Meds ordered this encounter  Medications   albuterol (VENTOLIN HFA) 108 (90 Base) MCG/ACT inhaler    Sig: Inhale 2 puffs into the lungs every 6 (six) hours as needed for wheezing or shortness of breath.    Dispense:  8 g    Refill:  0    Supervising Provider:   Danise Edge A [[id number]]   predniSONE (DELTASONE) 10 MG tablet    Sig: 4 tabs by mouth once daily for  2 days, then 3 tabs daily x 2 days, then 2 tabs daily x 2 days, then 1 tab daily x 2 days    Dispense:  20 tablet    Refill:  0    Supervising Provider:   Danise Edge A [[id number]]   promethazine-dextromethorphan (PROMETHAZINE-DM) 6.25-15 MG/5ML syrup    Sig: Take 5 mLs by mouth 4 (four) times daily as needed for cough.    Dispense:  118 mL    Refill:  0    Supervising Provider:   Danise Edge A [[id number]]   azithromycin (ZITHROMAX) 250 MG tablet    Sig: Take 2 tablets on day 1, then 1 tablet daily on days 2 through 5    Dispense:  6 tablet    Refill:  0    Supervising Provider:   Danise Edge A [[id number]]

## 2023-11-23 NOTE — Telephone Encounter (Signed)
Contacted pt. Reviewed CXR results, medication rx to include zpak, and recommendation for pulmonary referral. Pt given phone number for pulmonary to call to schedule appointment.

## 2023-11-23 NOTE — Assessment & Plan Note (Signed)
New.  CXR is performed and notes the following:   Moderate pulmonary interstitial thickening, likely related to the clinical history of asthma. Chronic bronchitis could look similar.  No evidence of pneumonia.   Rx with albuterol, prednisone, zpak. I would like for her to see pulmonology as well due to the interstitial changes noted on CXR to make sure that she doesn't have any other type of interstitial lung disease.

## 2023-12-28 ENCOUNTER — Ambulatory Visit: Payer: Managed Care, Other (non HMO) | Admitting: Internal Medicine

## 2023-12-28 ENCOUNTER — Encounter: Payer: Self-pay | Admitting: Internal Medicine

## 2023-12-28 VITALS — BP 110/75 | HR 75 | Ht 63.0 in | Wt 117.6 lb

## 2023-12-28 DIAGNOSIS — Z87898 Personal history of other specified conditions: Secondary | ICD-10-CM | POA: Diagnosis not present

## 2023-12-28 DIAGNOSIS — R0789 Other chest pain: Secondary | ICD-10-CM | POA: Diagnosis not present

## 2023-12-28 DIAGNOSIS — R0609 Other forms of dyspnea: Secondary | ICD-10-CM | POA: Diagnosis not present

## 2023-12-28 LAB — CBC WITH DIFFERENTIAL/PLATELET
Basophils Absolute: 0.1 10*3/uL (ref 0.0–0.1)
Basophils Relative: 0.8 % (ref 0.0–3.0)
Eosinophils Absolute: 0.1 10*3/uL (ref 0.0–0.7)
Eosinophils Relative: 1.8 % (ref 0.0–5.0)
HCT: 42 % (ref 36.0–46.0)
Hemoglobin: 13.9 g/dL (ref 12.0–15.0)
Lymphocytes Relative: 31.4 % (ref 12.0–46.0)
Lymphs Abs: 2.2 10*3/uL (ref 0.7–4.0)
MCHC: 33.1 g/dL (ref 30.0–36.0)
MCV: 92.4 fL (ref 78.0–100.0)
Monocytes Absolute: 0.5 10*3/uL (ref 0.1–1.0)
Monocytes Relative: 6.8 % (ref 3.0–12.0)
Neutro Abs: 4.1 10*3/uL (ref 1.4–7.7)
Neutrophils Relative %: 59.2 % (ref 43.0–77.0)
Platelets: 303 10*3/uL (ref 150.0–400.0)
RBC: 4.55 Mil/uL (ref 3.87–5.11)
RDW: 12.9 % (ref 11.5–15.5)
WBC: 6.9 10*3/uL (ref 4.0–10.5)

## 2023-12-28 LAB — POCT EXHALED NITRIC OXIDE: FeNO level (ppb): 6

## 2023-12-28 NOTE — Patient Instructions (Addendum)
ICD-10-CM   1. DOE (dyspnea on exertion)  R06.09     2. Feeling of chest tightness  R07.89     3. History of wheezing  Z87.898     4. History of exercise intolerance  Z87.898      Common things being common I wonder if you are having some low-grade asthma that is then precipitated by viral illnesses.  This could potentially explain effort intolerance when you try to run more than a mile.  The history of asthma in high school years and your current story in the context of younger age and otherwise limited medical issues suggest this.  Plan  - Do full pulmonary function test with bronchodilator response. - Take sample Trelegy inhaler 1 puff once daily for at least 6 weeks  -See if this makes a difference in your overall effort tolerance -Use albuterol as needed and definitely tried 5 or 10 minutes before you exercise -Continue to do warm up and cool down with exercise -Do RAST allergy panel with CBC and differential  Follow-up - 6 weeks video visit with Dr. Marchelle Gearing or nurse practitioner  -If no answers with this above approach then consider echocardiogram or limited autoimmune panel or pulmonary stress test

## 2023-12-28 NOTE — Addendum Note (Signed)
Addended by: Glynda Jaeger on: 12/28/2023 01:28 PM   Modules accepted: Orders

## 2023-12-28 NOTE — Progress Notes (Signed)
OV 12/28/2023  Subjective:  Patient ID: Carrie Combs, female , DOB: 1986/09/06 , age 38 y.o. , MRN: [id number] , ADDRESS: [address] [place] [address] PCP Sandford Craze, NP Patient Care Team: Sandford Craze, NP as PCP - General (Internal Medicine) Essie Hart, MD (Inactive) as Consulting Physician (Obstetrics and Gynecology) Hildred Laser, MD (Inactive) as Consulting Physician (Urology) Hilarie Fredrickson, MD as Consulting Physician (Gastroenterology) Ether Griffins, MD as Consulting Physician (Dentistry) Noland Fordyce, MD as Consulting Physician (Obstetrics and Gynecology)  This Provider for this visit: Treatment Team:  Attending Provider: Kalman Shan, MD    12/28/2023 -   Chief Complaint  Patient presents with   Consult    Pt states she was told she had asthma in high school. Had bronchitis in December, cxr 12/26     HPI Carrie Combs 38 y.o. -new consult referred for x-ray changes.  She is 37.  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  She says when she was in high school she used to play volleyball.  She was in the high school team.  She played pretty good.  However during exertion at that time she would get short of breath.  She was then diagnosed with exercise-induced asthma and used albuterol inhaler preemptively.  This really helped but several months into high school volleyball she kind of felt she outgrew that and she did not need albuterol.  Then during college years she was not very physically active but in her subsequent adult years she has been active for the last several years physically exercising.  A couple of years ago she did try extreme high intensity interval training but this was very difficult for her then she quit after 4 months.  It was difficult because of what she felt was undue dyspnea but also it was causing joint pain and other potential strain on the musculoskeletal system.  After that she is now doing on YouTube through  an app called SCULPT with dumbbells at home.  She exercises 4 to 5 days a week.  She is able to do pretty well but she says despite this every time she tries to run and she hates 1 mile mark she gets winded and she has some chest tightness and is unable to do any further.  Women her age and her peers are able to perform this activity.  She is wondering despite the level of physical fitness is not able to do this long distance running.  Then most recently around Christmas eve she picked up some chest tightness but no fever no runny nose no cold no sputum production.  She saw the primary care doctor.  There was some expiratory wheeze when she was breathing and it was audible.  She was given Z-Pak and prednisone and this helped and she is returned to baseline.  The baseline as above.  She is seen a physician and has had a lot of testing but she showed the test none of it is seasonal allergies.  She recollects having had spring allergy testing many years ago on her back but does not know the results.   Chest x-ray personally visualized with that shows slight hyperinflation.  I think this is consistent with asthma overall clear.  I do not fully agree with the interstitial thickening in the report.   FENO 6 ppb Is is normal.  There is no known family history of autoimmune disease.  There is no Raynaud's  in her nose sicca symptoms.  No history of rheumatoid arthritis any collagen vascular disease in her.  No heart disease.  e & Impression  CLINICAL DATA:  Wheezing. Rule out pneumonia. History of exercise-induced asthma   EXAM: CHEST - 2 VIEW   COMPARISON:  None Available.   FINDINGS: Midline trachea. Normal heart size and mediastinal contours. No pleural effusion or pneumothorax. Moderate pulmonary interstitial thickening. No lobar consolidation. Cholecystectomy clips.   IMPRESSION: Moderate pulmonary interstitial thickening, likely related to the clinical history of asthma. Chronic  bronchitis could look similar.   No evidence of pneumonia.     Electronically Signed   By: Jeronimo Greaves M.D.   On: 11/23/2023 14:47      PFT      No data to display          Latest Reference Range & Units 11/14/15 09:05 03/07/16 12:13 03/11/17 09:55 03/13/17 09:27 01/29/18 00:52 01/31/18 06:20 03/23/18 10:24 02/29/20 08:31 02/12/21 16:17  Eosinophils Absolute 0.0 - 0.7 K/uL 0.0 0.2 0.2 0.1 0.0 0.1 0.2 0.4 0.2     LAB RESULTS last 96 hours No results found.  LAB RESULTS last 90 days No results found for this or any previous visit (from the past 2160 hours).       has a past medical history of Abnormal LFTs (2019), Asthma, History of kidney stones, Internal hemorrhoids, Migraine, Pancreatitis, and PONV (postoperative nausea and vomiting).   reports that she has never smoked. She has never used smokeless tobacco.  Past Surgical History:  Procedure Laterality Date   CERVICAL POLYPECTOMY N/A 12/16/2019   Procedure: CERVICAL POLYPECTOMY;  Surgeon: Waynard Reeds, MD;  Location: [facility];  Service: Gynecology;  Laterality: N/A;   CHOLECYSTECTOMY  2003   laparoscopic   EUS N/A 05/01/2016   Procedure: UPPER ENDOSCOPIC ULTRASOUND (EUS) LINEAR;  Surgeon: Rachael Fee, MD;  Location: [facility];  Service: Endoscopy;  Laterality: N/A;   HYSTEROSCOPY WITH D & C N/A 12/16/2019   Procedure: DILATATION AND CURETTAGE /HYSTEROSCOPY;  Surgeon: Waynard Reeds, MD;  Location: [facility];  Service: Gynecology;  Laterality: N/A;   KIDNEY STONE SURGERY  2009   LITHOTRIPSY  12/2015   WISDOM TOOTH EXTRACTION  10/2002    Allergies  Allergen Reactions   Hydrocodone Nausea And Vomiting    Immunization History  Administered Date(s) Administered   Moderna SARS-COV2 Booster Vaccination 11/17/2020   Moderna Sars-Covid-2 Vaccination 02/14/2020, 03/13/2020   Tdap 02/08/2015    Family History  Problem Relation Age of Onset   Hyperlipidemia Mother     Diabetes Mellitus II Mother        borderline   Diabetes Father    Hypertension Father    Alcohol abuse Father    Frontotemporal dementia Father        tentative (still undergoing work up -- 03/2017)   Migraines Sister    Rheum arthritis Sister    Alzheimer's disease Maternal Grandmother    Arthritis Maternal Grandmother    Cervical cancer Maternal Grandmother    Parkinson's disease Maternal Grandfather    Stroke Paternal Grandmother    Heart disease Paternal Grandmother    Cancer Paternal Grandfather        liver   Alcohol abuse Paternal Grandfather    Obesity Half-Sister    Obesity Half-Sister    Other Neg Hx      Current Outpatient Medications:    albuterol (VENTOLIN HFA) 108 (90 Base) MCG/ACT inhaler, Inhale 2 puffs into the  lungs every 6 (six) hours as needed for wheezing or shortness of breath., Disp: 8 g, Rfl: 0   aspirin EC 81 MG tablet, Take 1 tablet (81 mg total) by mouth daily., Disp:  , Rfl:    cetirizine (ZYRTEC) 10 MG tablet, Take 10 mg by mouth daily., Disp: , Rfl:    ibuprofen (ADVIL) 600 MG tablet, Take 1 tablet (600 mg total) by mouth every 6 (six) hours as needed., Disp: 90 tablet, Rfl: 0   magnesium oxide (MAG-OX) 400 MG tablet, Take 400 mg by mouth daily., Disp: , Rfl:    SUMAtriptan (IMITREX) 50 MG tablet, Take 1 tablet (50 mg total) by mouth every 2 (two) hours as needed for migraine. May repeat in 2 hours if headache persists or recurs., Disp: 10 tablet, Rfl: 5      Objective:   Vitals:   12/28/23 0922  BP: 110/75  Pulse: 75  SpO2: 99%  Weight: 117 lb 9.6 oz (53.3 kg)  Height: 5\' 3"  (1.6 m)    Estimated body mass index is 20.83 kg/m as calculated from the following:   Height as of this encounter: 5\' 3"  (1.6 m).   Weight as of this encounter: 117 lb 9.6 oz (53.3 kg).  @WEIGHTCHANGE @  [employer]   12/28/23 0922  Weight: 117 lb 9.6 oz (53.3 kg)     Physical Exam   General: No distress. Looks well O2 at rest: no Cane present:  no Sitting in wheel chair: no Frail: no Obese: no Neuro: Alert and Oriented x 3. GCS 15. Speech normal Psych: Pleasant Resp:  Barrel Chest - no.  Wheeze - no, Crackles - no, No overt respiratory distress CVS: Normal heart sounds. Murmurs - no Ext: Stigmata of Connective Tissue Disease - no HEENT: Normal upper airway. PEERL +. No post nasal drip        Assessment:       ICD-10-CM   1. DOE (dyspnea on exertion)  R06.09     2. Feeling of chest tightness  R07.89     3. History of wheezing  Z87.898     4. History of exercise intolerance  Z87.898          Plan:     Patient Instructions     ICD-10-CM   1. DOE (dyspnea on exertion)  R06.09     2. Feeling of chest tightness  R07.89     3. History of wheezing  Z87.898     4. History of exercise intolerance  Z87.898      Common things being common I wonder if you are having some low-grade asthma that is then precipitated by viral illnesses.  This could potentially explain effort intolerance when you try to run more than a mile.  The history of asthma in high school years and your current story in the context of younger age and otherwise limited medical issues suggest this.  Plan  - Do full pulmonary function test with bronchodilator response. - Take sample Trelegy inhaler 1 puff once daily for at least 6 weeks  -See if this makes a difference in your overall effort tolerance -Use albuterol as needed and definitely tried 5 or 10 minutes before you exercise -Continue to do warm up and cool down with exercise -Do RAST allergy panel with CBC and differential  Follow-up - 6 weeks video visit with Dr. Marchelle Gearing or nurse practitioner  -If no answers with this above approach then consider echocardiogram or limited autoimmune panel or pulmonary stress  test     FOLLOWUP Return in about 6 weeks (around 02/08/2024) for 15 min visit, with any of the APPS, with Dr Marchelle Gearing, VIDEO VISIT.    SIGNATURE    Dr. Kalman Shan,  M.D., F.C.C.P,  Pulmonary and Critical Care Medicine Staff Physician, [facility] Center Director - Interstitial Lung Disease  Program  Pulmonary Fibrosis [facility] at [facility] [place], [place], [address]  Pager: [phone], If no answer or between  15:00h - 7:00h: call [phone] Telephone: [phone]  10:26 AM 12/28/2023

## 2023-12-31 LAB — ALLERGEN PROFILE, PERENNIAL ALLERGEN IGE
Alternaria Alternata IgE: <0.1 kU/L
Aspergillus Fumigatus IgE: <0.1 kU/L
Aureobasidi Pullulans IgE: <0.1 kU/L
Candida Albicans IgE: <0.1 kU/L
Cat Dander IgE: <0.1 kU/L
Chicken Feathers IgE: <0.1 kU/L
Cladosporium Herbarum IgE: <0.1 kU/L
Cow Dander IgE: <0.1 kU/L
D Farinae IgE: <0.1 kU/L
D Pteronyssinus IgE: 0.13 kU/L — AB
Dog Dander IgE: <0.1 kU/L
Duck Feathers IgE: <0.1 kU/L
Goose Feathers IgE: <0.1 kU/L
Mouse Urine IgE: <0.1 kU/L
Mucor Racemosus IgE: <0.1 kU/L
Penicillium Chrysogen IgE: <0.1 kU/L
Phoma Betae IgE: <0.1 kU/L
Setomelanomma Rostrat: <0.1 kU/L
Stemphylium Herbarum IgE: <0.1 kU/L

## 2024-01-07 ENCOUNTER — Encounter: Payer: Self-pay | Admitting: Internal Medicine

## 2024-01-22 ENCOUNTER — Institutional Professional Consult (permissible substitution): Payer: Managed Care, Other (non HMO) | Admitting: Pulmonary Disease

## 2024-02-19 ENCOUNTER — Telehealth: Payer: Managed Care, Other (non HMO) | Admitting: Internal Medicine

## 2024-02-19 ENCOUNTER — Encounter: Payer: Self-pay | Admitting: Internal Medicine

## 2024-02-19 DIAGNOSIS — Z9109 Other allergy status, other than to drugs and biological substances: Secondary | ICD-10-CM | POA: Diagnosis not present

## 2024-02-19 DIAGNOSIS — J45990 Exercise induced bronchospasm: Secondary | ICD-10-CM

## 2024-02-19 DIAGNOSIS — R062 Wheezing: Secondary | ICD-10-CM

## 2024-02-19 MED ORDER — ALBUTEROL SULFATE HFA 108 (90 BASE) MCG/ACT IN AERS
2.0000 | INHALATION_SPRAY | Freq: Four times a day (QID) | RESPIRATORY_TRACT | 2 refills | Status: AC | PRN
Start: 2024-02-19 — End: ?

## 2024-02-19 MED ORDER — ALBUTEROL SULFATE HFA 108 (90 BASE) MCG/ACT IN AERS: 2.0000 | INHALATION_SPRAY | Freq: Four times a day (QID) | RESPIRATORY_TRACT | 0 refills | Status: AC | PRN

## 2024-02-19 MED ORDER — MONTELUKAST SODIUM 10 MG PO TABS: 10.0000 mg | ORAL_TABLET | Freq: Every day | ORAL | 11 refills | Status: AC

## 2024-02-19 NOTE — Progress Notes (Signed)
 OV 12/28/2023  Subjective:  Patient ID: Carrie Combs, female , DOB: 18-Nov-1986 , age 38 y.o. , MRN: [id number] , ADDRESS: [address] [place] [address] PCP Sandford Craze, NP Patient Care Team: Sandford Craze, NP as PCP - General (Internal Medicine) Essie Hart, MD (Inactive) as Consulting Physician (Obstetrics and Gynecology) Hildred Laser, MD (Inactive) as Consulting Physician (Urology) Hilarie Fredrickson, MD as Consulting Physician (Gastroenterology) Ether Griffins, MD as Consulting Physician (Dentistry) Noland Fordyce, MD as Consulting Physician (Obstetrics and Gynecology)  This Provider for this visit: Treatment Team:  Attending Provider: Kalman Shan, MD    12/28/2023 -   Chief Complaint  Patient presents with   Consult    Pt states she was told she had asthma in high school. Had bronchitis in December, cxr 12/26     HPI Carrie Combs 38 y.o. -new consult referred for x-ray changes.  She is 37.  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  She says when she was in high school she used to play volleyball.  She was in the high school team.  She played pretty good.  However during exertion at that time she would get short of breath.  She was then diagnosed with exercise-induced asthma and used albuterol inhaler preemptively.  This really helped but several months into high school volleyball she kind of felt she outgrew that and she did not need albuterol.  Then during college years she was not very physically active but in her subsequent adult years she has been active for the last several years physically exercising.  A couple of years ago she did try extreme high intensity interval training but this was very difficult for her then she quit after 4 months.  It was difficult because of what she felt was undue dyspnea but also it was causing joint pain and other potential strain on the musculoskeletal system.  After that she is now doing on YouTube through an  app called SCULPT with dumbbells at home.  She exercises 4 to 5 days a week.  She is able to do pretty well but she says despite this every time she tries to run and she hates 1 mile mark she gets winded and she has some chest tightness and is unable to do any further.  Women her age and her peers are able to perform this activity.  She is wondering despite the level of physical fitness is not able to do this long distance running.  Then most recently around Christmas eve she picked up some chest tightness but no fever no runny nose no cold no sputum production.  She saw the primary care doctor.  There was some expiratory wheeze when she was breathing and it was audible.  She was given Z-Pak and prednisone and this helped and she is returned to baseline.  The baseline as above.  She is seen a physician and has had a lot of testing but she showed the test none of it is seasonal allergies.  She recollects having had spring allergy testing many years ago on her back but does not know the results.   Chest x-ray personally visualized with that shows slight hyperinflation.  I think this is consistent with asthma overall clear.  I do not fully agree with the interstitial thickening in the report.   FENO 6 ppb Is is normal.  There is no known family history of autoimmune disease.  There is no Raynaud's in  her nose sicca symptoms.  No history of rheumatoid arthritis any collagen vascular disease in her.  No heart disease.  e & Impression  CLINICAL DATA:  Wheezing. Rule out pneumonia. History of exercise-induced asthma   EXAM: CHEST - 2 VIEW   COMPARISON:  None Available.   FINDINGS: Midline trachea. Normal heart size and mediastinal contours. No pleural effusion or pneumothorax. Moderate pulmonary interstitial thickening. No lobar consolidation. Cholecystectomy clips.   IMPRESSION: Moderate pulmonary interstitial thickening, likely related to the clinical history of asthma. Chronic bronchitis  could look similar.   No evidence of pneumonia.     Electronically Signed   By: Jeronimo Greaves M.D.   On: 11/23/2023 14:47      PFT  OV 02/19/2024  Subjective:  Patient ID: Carrie Combs, female , DOB: 15-Jan-1986 , age 5 y.o. , MRN: [id number] , ADDRESS: [address] [place] [address] PCP Sandford Craze, NP Patient Care Team: Sandford Craze, NP as PCP - General (Internal Medicine) Essie Hart, MD (Inactive) as Consulting Physician (Obstetrics and Gynecology) Hildred Laser, MD (Inactive) as Consulting Physician (Urology) Hilarie Fredrickson, MD as Consulting Physician (Gastroenterology) Ether Griffins, MD as Consulting Physician (Dentistry) Noland Fordyce, MD as Consulting Physician (Obstetrics and Gynecology)  This Provider for this visit: Treatment Team:  Attending Provider: Kalman Shan, MD   Type of visit: Video Virtual Visit Identification of patient Carrie Combs with 16-May-1986 and MRN [id number] - 2 person identifier Risks: Risks, benefits, limitations of telephone visit explained. Patient understood and verbalized agreement to proceed Anyone else on call: just her Patient location: her home This provider location: [address]; [place]; [place] [address]. [facility]. [phone]    02/19/2024 -follow-up exercise-induced dyspnea   HPI [name] 37 y.o. -returns for follow-up for this video visit.  She has exercise-induced dyspnea.  There is a background history of asthma as a youth.  Blood work showed dust mite allergy.  She is made some improvements in the house.  She plans to get carpet removed.  She already has new pillows.  Advised her on washing comforters and pillows with hot water.  In terms of her dyspnea she is on Trelegy samples and this is actually improved her.  When she does not take it she feels more dyspnea.  Particularly when she goes with her children for the sports activities and when she has to  exert herself.  Based on the above have given her diagnosis of exercise-induced asthma  We discussed options about doing his maintenance inhaled steroid versus Airsupra as needed versus Singulair.  She prefer the latter 2.  With the goal of having to ideally avoid inhaled steroids over the long run we decided to settle on Singulair at night with albuterol as needed.  She is okay with this.  Pulmonary function test not done I was okay with that because of scheduling errors.  She can have this done in 3 months when she comes back for follow-up.      No results found for: "NITRICOXIDE"    Latest Reference Range & Units 12/28/23 10:36  Class Description Allergens  Comment  D Pteronyssinus IgE Class 0/I kU/L 0.13 !  D Farinae IgE Class 0 kU/L <0.10  Cat Dander IgE Class 0 kU/L <0.10  Dog Dander IgE Class 0 kU/L <0.10  Penicillium Chrysogen IgE Class 0 kU/L <0.10  Cladosporium Herbarum IgE Class 0 kU/L <0.10  Aspergillus Fumigatus IgE Class 0 kU/L <  0.10  Mucor Racemosus IgE Class 0 kU/L <0.10  Alternaria Alternata IgE Class 0 kU/L <0.10  Stemphylium Herbarum IgE Class 0 kU/L <0.10  Goose Feathers IgE Class 0 kU/L <0.10  Chicken Feathers IgE Class 0 kU/L <0.10  Duck Feathers IgE Class 0 kU/L <0.10    PFT      No data to display             LAB RESULTS last 96 hours No results found.       has a past medical history of Abnormal LFTs (2019), Asthma, History of kidney stones, Internal hemorrhoids, Migraine, Pancreatitis, and PONV (postoperative nausea and vomiting).   reports that she has never smoked. She has never used smokeless tobacco.  Past Surgical History:  Procedure Laterality Date   CERVICAL POLYPECTOMY N/A 12/16/2019   Procedure: CERVICAL POLYPECTOMY;  Surgeon: Waynard Reeds, MD;  Location: [facility];  Service: Gynecology;  Laterality: N/A;   CHOLECYSTECTOMY  2003   laparoscopic   EUS N/A 05/01/2016   Procedure: UPPER ENDOSCOPIC ULTRASOUND  (EUS) LINEAR;  Surgeon: Rachael Fee, MD;  Location: [facility];  Service: Endoscopy;  Laterality: N/A;   HYSTEROSCOPY WITH D & C N/A 12/16/2019   Procedure: DILATATION AND CURETTAGE /HYSTEROSCOPY;  Surgeon: Waynard Reeds, MD;  Location: [facility];  Service: Gynecology;  Laterality: N/A;   KIDNEY STONE SURGERY  2009   LITHOTRIPSY  12/2015   WISDOM TOOTH EXTRACTION  10/2002    Allergies  Allergen Reactions   Hydrocodone Nausea And Vomiting    Immunization History  Administered Date(s) Administered   Moderna SARS-COV2 Booster Vaccination 11/17/2020   Moderna Sars-Covid-2 Vaccination 02/14/2020, 03/13/2020   Tdap 02/08/2015    Family History  Problem Relation Age of Onset   Hyperlipidemia Mother    Diabetes Mellitus II Mother        borderline   Diabetes Father    Hypertension Father    Alcohol abuse Father    Frontotemporal dementia Father        tentative (still undergoing work up -- 03/2017)   Migraines Sister    Rheum arthritis Sister    Alzheimer's disease Maternal Grandmother    Arthritis Maternal Grandmother    Cervical cancer Maternal Grandmother    Parkinson's disease Maternal Grandfather    Stroke Paternal Grandmother    Heart disease Paternal Grandmother    Cancer Paternal Grandfather        liver   Alcohol abuse Paternal Grandfather    Obesity Half-Sister    Obesity Half-Sister    Other Neg Hx      Current Outpatient Medications:    albuterol (VENTOLIN HFA) 108 (90 Base) MCG/ACT inhaler, Inhale 2 puffs into the lungs every 6 (six) hours as needed for wheezing or shortness of breath., Disp: 8 g, Rfl: 2   montelukast (SINGULAIR) 10 MG tablet, Take 1 tablet (10 mg total) by mouth at bedtime., Disp: 30 tablet, Rfl: 11   albuterol (VENTOLIN HFA) 108 (90 Base) MCG/ACT inhaler, Inhale 2 puffs into the lungs every 6 (six) hours as needed for wheezing or shortness of breath., Disp: 8 g, Rfl: 0   aspirin EC 81 MG tablet, Take 1 tablet (81 mg total)  by mouth daily., Disp:  , Rfl:    cetirizine (ZYRTEC) 10 MG tablet, Take 10 mg by mouth daily., Disp: , Rfl:    ibuprofen (ADVIL) 600 MG tablet, Take 1 tablet (600 mg total) by mouth every 6 (six) hours as needed.,  Disp: 90 tablet, Rfl: 0   magnesium oxide (MAG-OX) 400 MG tablet, Take 400 mg by mouth daily., Disp: , Rfl:    SUMAtriptan (IMITREX) 50 MG tablet, Take 1 tablet (50 mg total) by mouth every 2 (two) hours as needed for migraine. May repeat in 2 hours if headache persists or recurs., Disp: 10 tablet, Rfl: 5      Objective:   There were no vitals filed for this visit.  Estimated body mass index is 20.83 kg/m as calculated from the following:   Height as of 12/28/23: 5\' 3"  (1.6 m).   Weight as of 12/28/23: 117 lb 9.6 oz (53.3 kg).  @WEIGHTCHANGE @  There were no vitals filed for this visit.   Physical Exam   General: No distress. Looks well O2 at rest: no Cane present: no Sitting in wheel chair: no Frail: no Obese: no Neuro: Alert and Oriented x 3. GCS 15. Speech normal Psych: Pleasant Resp:  , No overt respiratory distress       Assessment:       ICD-10-CM   1. Exercise-induced asthma  J45.990 Pulmonary function test    2. House dust mite allergy  Z91.09 Pulmonary function test    3. Wheezing  R06.2 Pulmonary function test    albuterol (VENTOLIN HFA) 108 (90 Base) MCG/ACT inhaler         Plan:     Patient Instructions     ICD-10-CM   1. Exercise-induced asthma  J45.990     2. House dust mite allergy  Z91.09      # -Based on the fact you do feel better with Trelegy and you feel worse when you do not take Trelegy -> suggest you have exercise-induced asthma/bronchospasm - -Blood test revealed mild house dust mite allergy  Plan  -discussed various options and took shared decision making -Start Singulair 10 mg once a day at night - Do albuterol as needed + but more importantly 10 minutes before exercise -Continue to do warm up and cool  down -Control house dust mite as documented below -Do full pulmonary function test in 3 months - if meds are too expensive let us know  Follow-up - 3 months after full pulmonary function test to document response    Xxxxxx   Google Dust Mite Allergy and get info from [facility] [employer] and pillows in zippered dust-proof covers. These covers are made of a material with pores too small to let dust mites and their waste product through. They are also called allergen-impermeable. Plastic or vinyl covers are the least expensive, but some people find them uncomfortable. You can buy other fabric allergen-impermeable covers from many regular bedding stores.   Wash your sheets and blankets weekly in hot water. You have to wash them in water that's at least 130 F or more to kill dust mites.  Get rid of all types of fabric that mites love and that you cannot easily wash regularly in hot water.   Avoid wall-to-wall carpeting, curtains, blinds, upholstered furniture and down-filled covers and pillows in the bedroom. Put roll-type shades on your windows instead of curtains.  Have someone without a dust mite allergy clean your bedroom. If this is not possible, wear a filtering mask when dusting or vacuuming. Many drug stores carry these items. Dusting and vacuuming stir up dust. So try to do these chores when you can stay out of the bedroom for a while afterward.   Special CERTIFIED  filter vacuum cleaners can help to keep mites and mite waste from getting back into the air. CERTIFIED vacuums are scientifically tested and verified as more suitable for making your home healthier.  Vacuuming is not enough to remove all dust mites and their waste. A large amount of the dust mite population may remain because they live deep inside the stuffing of sofas, chairs, mattresses, pillows, and carpeting.  Treat other rooms in your house like your bedroom. Here are more  tips:  Avoid wall-to-wall carpeting, if possible. If you do use carpeting, mites don't like the type with a short, tight pile as much. Use washable throw rugs over regularly damp-mopped wood, linoleum or tiled floors. Wash rugs in hot water whenever possible. Cold water isn't as effective. Dry cleaning kills all dust mites and is also good for removing dust mites from living in fabrics. Keep the humidity in your home less than 50%. Use a dehumidifier and/or air conditioner to do this. Use a CERTIFIED filter with your central furnace and air conditioning unit. This can help trap dust mites from your entire home. Freestanding air cleaners only filter air in a limited area. Avoid devices that treat air with heat, electrostatic ions, or ozon        FOLLOWUP Return in about 3 months (around 05/21/2024) for 15 min visit, with Dr Marchelle Gearing, after PFT.    SIGNATURE    Dr. Kalman Shan, M.D., F.C.C.P,  Pulmonary and Critical Care Medicine Staff Physician, [facility] Director - Interstitial Lung Disease  Program  Pulmonary Fibrosis [facility] at [facility] [place], [place], [address]  Pager: [phone], If no answer or between  15:00h - 7:00h: call [phone] Telephone: [phone]  2:15 PM 02/19/2024

## 2024-02-19 NOTE — Patient Instructions (Addendum)
 ICD-10-CM   1. Exercise-induced asthma  J45.990     2. House dust mite allergy  Z91.09      # -Based on the fact you do feel better with Trelegy and you feel worse when you do not take Trelegy -> suggest you have exercise-induced asthma/bronchospasm - -Blood test revealed mild house dust mite allergy  Plan  -discussed various options and took shared decision making -Start Singulair 10 mg once a day at night - Do albuterol as needed + but more importantly 10 minutes before exercise -Continue to do warm up and cool down -Control house dust mite as documented below -Do full pulmonary function test in 3 months - if meds are too expensive let us know  Follow-up - 3 months after full pulmonary function test to document response    Xxxxxx   Google Dust Mite Allergy and get info from [facility] [employer] and pillows in zippered dust-proof covers. These covers are made of a material with pores too small to let dust mites and their waste product through. They are also called allergen-impermeable. Plastic or vinyl covers are the least expensive, but some people find them uncomfortable. You can buy other fabric allergen-impermeable covers from many regular bedding stores.   Wash your sheets and blankets weekly in hot water. You have to wash them in water that's at least 130 F or more to kill dust mites.  Get rid of all types of fabric that mites love and that you cannot easily wash regularly in hot water.   Avoid wall-to-wall carpeting, curtains, blinds, upholstered furniture and down-filled covers and pillows in the bedroom. Put roll-type shades on your windows instead of curtains.  Have someone without a dust mite allergy clean your bedroom. If this is not possible, wear a filtering mask when dusting or vacuuming. Many drug stores carry these items. Dusting and vacuuming stir up dust. So try to do these chores when you can stay out of the bedroom  for a while afterward.   Special CERTIFIED filter vacuum cleaners can help to keep mites and mite waste from getting back into the air. CERTIFIED vacuums are scientifically tested and verified as more suitable for making your home healthier.  Vacuuming is not enough to remove all dust mites and their waste. A large amount of the dust mite population may remain because they live deep inside the stuffing of sofas, chairs, mattresses, pillows, and carpeting.  Treat other rooms in your house like your bedroom. Here are more tips:  Avoid wall-to-wall carpeting, if possible. If you do use carpeting, mites don't like the type with a short, tight pile as much. Use washable throw rugs over regularly damp-mopped wood, linoleum or tiled floors. Wash rugs in hot water whenever possible. Cold water isn't as effective. Dry cleaning kills all dust mites and is also good for removing dust mites from living in fabrics. Keep the humidity in your home less than 50%. Use a dehumidifier and/or air conditioner to do this. Use a CERTIFIED filter with your central furnace and air conditioning unit. This can help trap dust mites from your entire home. Freestanding air cleaners only filter air in a limited area. Avoid devices that treat air with heat, electrostatic ions, or ozon

## 2024-03-28 ENCOUNTER — Encounter: Payer: Managed Care, Other (non HMO) | Admitting: Family

## 2024-03-29 ENCOUNTER — Encounter: Payer: Self-pay | Admitting: Family

## 2024-03-29 ENCOUNTER — Ambulatory Visit (INDEPENDENT_AMBULATORY_CARE_PROVIDER_SITE_OTHER): Payer: Managed Care, Other (non HMO) | Admitting: Family

## 2024-03-29 VITALS — BP 94/54 | HR 70 | Temp 97.6°F | Resp 16 | Ht 63.0 in | Wt 115.0 lb

## 2024-03-29 DIAGNOSIS — Z1231 Encounter for screening mammogram for malignant neoplasm of breast: Secondary | ICD-10-CM

## 2024-03-29 DIAGNOSIS — Z0000 Encounter for general adult medical examination without abnormal findings: Secondary | ICD-10-CM

## 2024-03-29 DIAGNOSIS — G43109 Migraine with aura, not intractable, without status migrainosus: Secondary | ICD-10-CM

## 2024-03-29 DIAGNOSIS — J45990 Exercise induced bronchospasm: Secondary | ICD-10-CM | POA: Diagnosis not present

## 2024-03-29 LAB — COMPREHENSIVE METABOLIC PANEL WITH GFR
ALT: 17 U/L (ref 0–35)
AST: 15 U/L (ref 0–37)
Albumin: 4.7 g/dL (ref 3.5–5.2)
Alkaline Phosphatase: 61 U/L (ref 39–117)
BUN: 12 mg/dL (ref 6–23)
CO2: 31 meq/L (ref 19–32)
Calcium: 9.3 mg/dL (ref 8.4–10.5)
Chloride: 101 meq/L (ref 96–112)
Creatinine, Ser: 0.6 mg/dL (ref 0.40–1.20)
GFR: 112.72 mL/min (ref >60.00)
Glucose, Bld: 85 mg/dL (ref 70–99)
Potassium: 4.6 meq/L (ref 3.5–5.1)
Sodium: 137 meq/L (ref 135–145)
Total Bilirubin: 0.3 mg/dL (ref 0.2–1.2)
Total Protein: 6.9 g/dL (ref 6.0–8.3)

## 2024-03-29 LAB — LIPID PANEL
Cholesterol: 177 mg/dL (ref 0–200)
HDL: 59.9 mg/dL (ref >39.00)
LDL Cholesterol: 103 mg/dL — ABNORMAL HIGH (ref 0–99)
NonHDL: 117.35
Total CHOL/HDL Ratio: 3
Triglycerides: 72 mg/dL (ref 0.0–149.0)
VLDL: 14.4 mg/dL (ref 0.0–40.0)

## 2024-03-29 MED ORDER — SUMATRIPTAN SUCCINATE 50 MG PO TABS
50.0000 mg | ORAL_TABLET | ORAL | 5 refills | Status: DC | PRN
Start: 2024-03-29 — End: 2024-06-12

## 2024-03-29 NOTE — Assessment & Plan Note (Signed)
 Stable, being managed by pulmonology- Dr. Randalyn Bushman.

## 2024-03-29 NOTE — Assessment & Plan Note (Signed)
  Migraines occur once or twice monthly, often around ovulation or menstruation. Frequency decreased since eliminating dairy. Sumatriptan  effective for rescue. - Send refill for sumatriptan .

## 2024-03-29 NOTE — Assessment & Plan Note (Signed)
  Routine wellness visit. Reports healthy diet and engages in light strength training. No significant changes in medical history or lifestyle.  - Order mammogram after birthday. - Schedule cholesterol test. - Administer tetanus booster in April 2026. She wishes to wait until next year.

## 2024-03-29 NOTE — Progress Notes (Signed)
 Subjective:     Patient ID: Carrie Combs, female    DOB: 13-Jan-1986, 38 y.o.   MRN: [id number]  Chief Complaint  Patient presents with   Annual Exam    HPI  Discussed the use of AI scribe software for clinical note transcription with the patient, who gave verbal consent to proceed.  History of Present Illness  Carrie Combs is a 38 year old female who presents for a routine follow-up and management of headaches and exercise-induced asthma.  She experiences frequent headaches, which have decreased in frequency since eliminating dairy from her diet. Severe headaches occur once or twice a month, often coinciding with ovulation or menstruation. Last week, a headache lasted two and a half days. Sumatriptan  effectively manages her migraine symptoms.  She has exercise-induced asthma and uses albuterol  and Singulair  as prescribed. She engages in light body-style strength training with no current exacerbations.  Her menstrual periods fluctuate, and she is under the care of a functional medicine doctor for hormone regulation with supplements. She monitors her symptoms to assess the effectiveness of these supplements.  She consumes alcohol occasionally, does not use drugs, and her husband has had a vasectomy for birth control. She does not use tobacco. No current symptoms of cough, cold, skin issues, urinary issues, or unusual muscle or joint pain. No concerns about depression or anxiety. Her hearing and vision are okay.  Immunizations: up to date Diet: healthy Exercise: strength/pilates Pap Smear: due [facility]) Mammogram: due after 6/20 Vision: up to date Dental: up to date      Health Maintenance Due  Topic Date Due   Pneumococcal Vaccine 3-74 Years old (1 of 2 - PCV) Never done   Cervical Cancer Screening (HPV/Pap Cotest)  06/23/2022   COVID-19 Vaccine (4 - 2024-25 season) 08/02/2023    Past Medical History:  Diagnosis Date   Abnormal LFTs 2019   being evaluated  for this is due to micro galll stones    Asthma    exercise-induced   History of kidney stones    micro stones   Internal hemorrhoids    Migraine    monthly with menses   Pancreatitis    at times due to micro gall stones   PONV (postoperative nausea and vomiting)     Past Surgical History:  Procedure Laterality Date   CERVICAL POLYPECTOMY N/A 12/16/2019   Procedure: CERVICAL POLYPECTOMY;  Surgeon: Reggy Capers, MD;  Location: [facility];  Service: Gynecology;  Laterality: N/A;   CHOLECYSTECTOMY  2003   laparoscopic   EUS N/A 05/01/2016   Procedure: UPPER ENDOSCOPIC ULTRASOUND (EUS) LINEAR;  Surgeon: Janel Medford, MD;  Location: [facility];  Service: Endoscopy;  Laterality: N/A;   HYSTEROSCOPY WITH D & C N/A 12/16/2019   Procedure: DILATATION AND CURETTAGE /HYSTEROSCOPY;  Surgeon: Reggy Capers, MD;  Location: [facility];  Service: Gynecology;  Laterality: N/A;   KIDNEY STONE SURGERY  2009   LITHOTRIPSY  12/2015   WISDOM TOOTH EXTRACTION  10/2002    Family History  Problem Relation Age of Onset   Hyperlipidemia Mother    Diabetes Mellitus II Mother        borderline   Diabetes Father    Hypertension Father    Alcohol abuse Father    Frontotemporal dementia Father        tentative (still undergoing work up -- 03/2017)   Migraines Sister    Rheum arthritis Sister    Alzheimer's disease Maternal Grandmother  Arthritis Maternal Grandmother    Cervical cancer Maternal Grandmother    Parkinson's disease Maternal Grandfather    Stroke Paternal Grandmother    Heart disease Paternal Grandmother    Cancer Paternal Grandfather        liver   Alcohol abuse Paternal Grandfather    Obesity Half-Sister    Obesity Half-Sister    Other Neg Hx     Social History   Socioeconomic History   Marital status: Married    Spouse name: Not on file   Number of children: Not on file   Years of education: Not on file   Highest education level: Bachelor's  degree (e.g., BA, AB, BS)  Occupational History   Not on file  Tobacco Use   Smoking status: Never   Smokeless tobacco: Never  Vaping Use   Vaping status: Never Used  Substance and Sexual Activity   Alcohol use: Yes    Comment: occ   Drug use: No   Sexual activity: Not on file    Comment: husband had vasectomy  Other Topics Concern   Not on file  Social History Narrative   2014- daughter   2016- daugher   Married   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    Completed college   Enjoys spending time with friends, reading.    Completed college   Social Drivers of Health   Financial Resource Strain: Low Risk  (03/28/2024)   Overall Financial Resource Strain (CARDIA)    Difficulty of Paying Living Expenses: Not hard at all  Food Insecurity: No Food Insecurity (03/28/2024)   Hunger Vital Sign    Worried About Running Out of Food in the Last Year: Never true    Ran Out of Food in the Last Year: Never true  Transportation Needs: No Transportation Needs (03/28/2024)   PRAPARE - Administrator, Civil Service (Medical): No    Lack of Transportation (Non-Medical): No  Physical Activity: Insufficiently Active (03/28/2024)   Exercise Vital Sign    Days of Exercise per Week: 4 days    Minutes of Exercise per Session: 30 min  Stress: No Stress Concern Present (03/28/2024)   [employer] of Occupational Health - Occupational Stress Questionnaire    Feeling of Stress : Only a little  Social Connections: Moderately Isolated (03/28/2024)   Social Connection and Isolation Panel [NHANES]    Frequency of Communication with Friends and Family: More than three times a week    Frequency of Social Gatherings with Friends and Family: Once a week    Attends Religious Services: Never    Database administrator or Organizations: No    Attends Engineer, structural: Not on file    Marital Status: Married  Intimate Partner Violence: Unknown (08/14/2023)   Received from  [facility]   HITS    Physically Hurt: Not on file    Insult or Talk Down To: Not on file    Threaten Physical Harm: Not on file    Scream or Curse: Not on file    Outpatient Medications Prior to Visit  Medication Sig Dispense Refill   albuterol  (VENTOLIN  HFA) 108 (90 Base) MCG/ACT inhaler Inhale 2 puffs into the lungs every 6 (six) hours as needed for wheezing or shortness of breath. 8 g 2   albuterol  (VENTOLIN  HFA) 108 (90 Base) MCG/ACT inhaler Inhale 2 puffs into the lungs every 6 (six) hours as needed for wheezing or shortness of breath. 8 g 0  aspirin  EC 81 MG tablet Take 1 tablet (81 mg total) by mouth daily.     cetirizine (ZYRTEC) 10 MG tablet Take 10 mg by mouth daily.     ibuprofen  (ADVIL ) 600 MG tablet Take 1 tablet (600 mg total) by mouth every 6 (six) hours as needed. 90 tablet 0   MAGNESIUM CITRATE PO Take by mouth.     magnesium oxide (MAG-OX) 400 MG tablet Take 400 mg by mouth daily.     montelukast  (SINGULAIR ) 10 MG tablet Take 1 tablet (10 mg total) by mouth at bedtime. 30 tablet 11   Prenatal Vit-Fe Fumarate-FA (PRENATAL VITAMIN PO) Take by mouth.     Rhodiola rosea (RHODIOLA PO) Take by mouth.     SUMAtriptan  (IMITREX ) 50 MG tablet Take 1 tablet (50 mg total) by mouth every 2 (two) hours as needed for migraine. May repeat in 2 hours if headache persists or recurs. 10 tablet 5   No facility-administered medications prior to visit.    Allergies  Allergen Reactions   Hydrocodone Nausea And Vomiting    Review of Systems  Constitutional:  Negative for weight loss.  HENT:  Negative for congestion and hearing loss.   Respiratory:  Negative for cough.   Gastrointestinal:  Negative for heartburn.  Genitourinary:  Negative for frequency, hematuria and urgency.  Musculoskeletal:  Negative for joint pain and myalgias.  Skin:  Negative for rash.  Neurological:  Positive for headaches (improved without dairy- relieved with triptan).  Psychiatric/Behavioral:  Negative  for depression. The patient is not nervous/anxious.        Objective:    Physical Exam   BP (!) 94/54 (BP Location: Right Arm, Patient Position: Sitting, Cuff Size: Small)   Pulse 70   Temp 97.6 F (36.4 C) (Oral)   Resp 16   Ht 5\' 3"  (1.6 m)   Wt 115 lb (52.2 kg)   SpO2 100%   BMI 20.37 kg/m  Wt Readings from Last 3 Encounters:  03/29/24 115 lb (52.2 kg)  12/28/23 117 lb 9.6 oz (53.3 kg)  11/23/23 117 lb (53.1 kg)  Physical Exam  Constitutional: She is oriented to person, place, and time. She appears well-developed and well-nourished. No distress.  HENT:  Head: Normocephalic and atraumatic.  Right Ear: Tympanic membrane and ear canal normal.  Left Ear: Tympanic membrane and ear canal normal.  Mouth/Throat: Oropharynx is clear and moist.  Eyes: Pupils are equal, round, and reactive to light. No scleral icterus.  Neck: Normal range of motion. No thyromegaly present.  Cardiovascular: Normal rate and regular rhythm.   No murmur heard. Pulmonary/Chest: Effort normal and breath sounds normal. No respiratory distress. He has no wheezes. She has no rales. She exhibits no tenderness.  Abdominal: Soft. Bowel sounds are normal. She exhibits no distension and no mass. There is no tenderness. There is no rebound and no guarding.  Musculoskeletal: She exhibits no edema.  Lymphadenopathy:    She has no cervical adenopathy.  Neurological: She is alert and oriented to person, place, and time. She has normal patellar reflexes. She exhibits normal muscle tone. Coordination normal.  Skin: Skin is warm and dry.  Psychiatric: She has a normal mood and affect. Her behavior is normal. Judgment and thought content normal.  Breast/pelvic: deferred        Assessment & Plan:        Assessment & Plan:   Problem List Items Addressed This Visit       Unprioritized   Preventative health  care - Primary    Routine wellness visit. Reports healthy diet and engages in light strength training.  No significant changes in medical history or lifestyle.  - Order mammogram after birthday. - Schedule cholesterol test. - Administer tetanus booster in April 2026. She wishes to wait until next year.      Relevant Orders   Lipid panel   Comp Met (CMET)   Migraine with aura and without status migrainosus, not intractable    Migraines occur once or twice monthly, often around ovulation or menstruation. Frequency decreased since eliminating dairy. Sumatriptan  effective for rescue. - Send refill for sumatriptan .       Relevant Medications   SUMAtriptan  (IMITREX ) 50 MG tablet   Exercise-induced asthma   Stable, being managed by pulmonology- Dr. Randalyn Bushman.      Other Visit Diagnoses       Breast cancer screening by mammogram       Relevant Orders   MM 3D SCREENING MAMMOGRAM BILATERAL BREAST       I am having Ladan M. Gideon Kussmaul maintain her ibuprofen , aspirin  EC, cetirizine, magnesium oxide, montelukast , albuterol , albuterol , Prenatal Vit-Fe Fumarate-FA (PRENATAL VITAMIN PO), MAGNESIUM CITRATE PO, Rhodiola rosea (RHODIOLA PO), and SUMAtriptan .  Meds ordered this encounter  Medications   SUMAtriptan  (IMITREX ) 50 MG tablet    Sig: Take 1 tablet (50 mg total) by mouth every 2 (two) hours as needed for migraine. May repeat in 2 hours if headache persists or recurs.    Dispense:  10 tablet    Refill:  5

## 2024-03-29 NOTE — Patient Instructions (Signed)
 VISIT SUMMARY:  Today, you came in for a routine follow-up to manage your headaches and exercise-induced asthma. We also reviewed your general wellness and discussed your menstrual periods and hormone regulation.  YOUR PLAN:  -WELLNESS VISIT: This was a routine wellness visit where we discussed your overall health. You are engaging in light strength training and managing your menstrual periods with supplements. We will order a mammogram after your birthday, schedule a cholesterol test, and administer a tetanus booster in April 2026.  -MIGRAINE: Migraines are severe headaches that can cause intense pain, often around ovulation or menstruation. You have been experiencing them once or twice a month, but their frequency has decreased since you eliminated dairy from your diet. Sumatriptan  has been effective in managing your symptoms. We will send a refill for your sumatriptan  prescription.  -EXERCISE-INDUCED ASTHMA: Exercise-induced asthma is a condition where physical activity triggers asthma symptoms like shortness of breath. You are managing this condition with albuterol  and Singulair , and there are no current exacerbations.  INSTRUCTIONS:  Please schedule a mammogram after your birthday and a cholesterol test. Your next tetanus booster is due in April 2026. Continue using sumatriptan  as needed for migraines and manage your exercise-induced asthma with albuterol  and Singulair  as prescribed.

## 2024-05-02 ENCOUNTER — Ambulatory Visit (HOSPITAL_BASED_OUTPATIENT_CLINIC_OR_DEPARTMENT_OTHER)

## 2024-05-23 ENCOUNTER — Ambulatory Visit (HOSPITAL_BASED_OUTPATIENT_CLINIC_OR_DEPARTMENT_OTHER)

## 2024-06-11 ENCOUNTER — Other Ambulatory Visit: Payer: Self-pay | Admitting: Family

## 2024-06-11 DIAGNOSIS — G43109 Migraine with aura, not intractable, without status migrainosus: Secondary | ICD-10-CM

## 2024-06-13 ENCOUNTER — Ambulatory Visit (HOSPITAL_BASED_OUTPATIENT_CLINIC_OR_DEPARTMENT_OTHER)
Admission: RE | Admit: 2024-06-13 | Discharge: 2024-06-13 | Disposition: A | Discharge status: Home / Self Care | Source: Ambulatory Visit | Attending: Family | Admitting: Family

## 2024-06-13 DIAGNOSIS — Z1231 Encounter for screening mammogram for malignant neoplasm of breast: Secondary | ICD-10-CM | POA: Insufficient documentation

## 2024-06-17 ENCOUNTER — Other Ambulatory Visit: Payer: Self-pay | Admitting: Family

## 2024-06-17 ENCOUNTER — Ambulatory Visit: Payer: Self-pay

## 2024-06-17 DIAGNOSIS — R928 Other abnormal and inconclusive findings on diagnostic imaging of breast: Secondary | ICD-10-CM

## 2024-06-21 ENCOUNTER — Ambulatory Visit
Admission: RE | Admit: 2024-06-21 | Discharge: 2024-06-21 | Disposition: A | Discharge status: Home / Self Care | Source: Ambulatory Visit | Attending: Family | Admitting: Family

## 2024-06-21 DIAGNOSIS — R928 Other abnormal and inconclusive findings on diagnostic imaging of breast: Secondary | ICD-10-CM

## 2024-06-22 ENCOUNTER — Other Ambulatory Visit: Payer: Self-pay | Admitting: Family

## 2024-06-22 DIAGNOSIS — N6320 Unspecified lump in the left breast, unspecified quadrant: Secondary | ICD-10-CM

## 2024-06-22 DIAGNOSIS — R921 Mammographic calcification found on diagnostic imaging of breast: Secondary | ICD-10-CM

## 2024-12-23 ENCOUNTER — Ambulatory Visit
Admission: RE | Admit: 2024-12-23 | Discharge: 2024-12-23 | Disposition: A | Source: Ambulatory Visit | Attending: Family | Admitting: Family

## 2024-12-23 ENCOUNTER — Other Ambulatory Visit: Payer: Self-pay | Admitting: Family

## 2024-12-23 ENCOUNTER — Ambulatory Visit: Payer: Self-pay | Admitting: Family

## 2024-12-23 DIAGNOSIS — R921 Mammographic calcification found on diagnostic imaging of breast: Secondary | ICD-10-CM

## 2024-12-23 DIAGNOSIS — N6320 Unspecified lump in the left breast, unspecified quadrant: Secondary | ICD-10-CM

## 2024-12-23 DIAGNOSIS — N6322 Unspecified lump in the left breast, upper inner quadrant: Secondary | ICD-10-CM
